# Patient Record
Sex: Male | Born: 1960 | Race: Black or African American | Hispanic: No | Marital: Single | Smoking: Current every day smoker
Health system: Southern US, Community
[De-identification: ages and names within clinical notes are randomized; demographics above are authoritative.]

## PROBLEM LIST (undated history)

## (undated) DIAGNOSIS — M4712 Other spondylosis with myelopathy, cervical region: Secondary | ICD-10-CM

## (undated) DIAGNOSIS — F1020 Alcohol dependence, uncomplicated: Secondary | ICD-10-CM

## (undated) DIAGNOSIS — F1920 Other psychoactive substance dependence, uncomplicated: Secondary | ICD-10-CM

## (undated) DIAGNOSIS — R531 Weakness: Secondary | ICD-10-CM

## (undated) DIAGNOSIS — S42002A Fracture of unspecified part of left clavicle, initial encounter for closed fracture: Secondary | ICD-10-CM

## (undated) DIAGNOSIS — I426 Alcoholic cardiomyopathy: Secondary | ICD-10-CM

## (undated) HISTORY — DX: Other spondylosis with myelopathy, cervical region: M47.12

## (undated) HISTORY — DX: Alcohol dependence, uncomplicated: F10.20

## (undated) HISTORY — DX: Fracture of unspecified part of left clavicle, initial encounter for closed fracture: S42.002A

## (undated) HISTORY — DX: Other psychoactive substance dependence, uncomplicated: F19.20

## (undated) HISTORY — PX: LEG SURGERY: SHX1003

## (undated) HISTORY — DX: Alcoholic cardiomyopathy: I42.6

## (undated) HISTORY — DX: Weakness: R53.1

---

## 1999-02-24 ENCOUNTER — Emergency Department (HOSPITAL_COMMUNITY): Admission: EM | Admit: 1999-02-24 | Discharge: 1999-02-24 | Payer: Self-pay | Admitting: Emergency Medicine

## 1999-02-24 ENCOUNTER — Encounter: Payer: Self-pay | Admitting: Emergency Medicine

## 1999-03-07 ENCOUNTER — Emergency Department (HOSPITAL_COMMUNITY): Admission: EM | Admit: 1999-03-07 | Discharge: 1999-03-07 | Payer: Self-pay | Admitting: Emergency Medicine

## 2006-10-26 ENCOUNTER — Emergency Department (HOSPITAL_COMMUNITY): Admission: EM | Admit: 2006-10-26 | Discharge: 2006-10-26 | Payer: Self-pay | Admitting: Emergency Medicine

## 2006-10-27 ENCOUNTER — Emergency Department (HOSPITAL_COMMUNITY): Admission: EM | Admit: 2006-10-27 | Discharge: 2006-10-27 | Payer: Self-pay | Admitting: Emergency Medicine

## 2015-12-30 ENCOUNTER — Encounter: Payer: Self-pay | Admitting: Internal Medicine

## 2015-12-30 ENCOUNTER — Ambulatory Visit (INDEPENDENT_AMBULATORY_CARE_PROVIDER_SITE_OTHER): Payer: Self-pay | Admitting: Internal Medicine

## 2015-12-30 VITALS — BP 100/62 | HR 100 | Resp 18 | Ht 68.0 in | Wt 163.0 lb

## 2015-12-30 DIAGNOSIS — R29898 Other symptoms and signs involving the musculoskeletal system: Secondary | ICD-10-CM | POA: Insufficient documentation

## 2015-12-30 DIAGNOSIS — F1910 Other psychoactive substance abuse, uncomplicated: Secondary | ICD-10-CM | POA: Insufficient documentation

## 2015-12-30 DIAGNOSIS — M6281 Muscle weakness (generalized): Secondary | ICD-10-CM

## 2015-12-30 DIAGNOSIS — M6289 Other specified disorders of muscle: Secondary | ICD-10-CM

## 2015-12-30 DIAGNOSIS — F1010 Alcohol abuse, uncomplicated: Secondary | ICD-10-CM

## 2015-12-30 NOTE — Patient Instructions (Addendum)
Please get signed up for orange card as soon as possible so we can fax off referrals for srays and MRI of neck

## 2015-12-30 NOTE — Progress Notes (Addendum)
Subjective:    Patient ID: Donald Salas, male    DOB: [birth year], 55 y.o.   MRN: [id number]  HPI   1.  Left arm with difficulties "hanging down"  For 2 years.  Did not note any other part of body with weakness.  Gradually developed less control and strength in arm since then.  Last 4 months, has not been able to lift his arm up.  Has to lift his arm up and place it on a horizontal surface.  Can have numbness and tingling in arm--mainly at night when sitting in chair watching TV.  No neck pain.  No headaches.  No slurred speech he is aware of.  2.  Different pains in his legs with swelling in right calf.  Does have pain in lower legs bilaterally and has to sit down and rest intermittently when he walks.  He mainly gets around via bicycle, does not notice pain with cycling.    He has not been seen by a physician since he was likely in his 30s after cutting his leg with a chain saw and seen in ED to stitch it up.  Meds:  None  Allergies: NKDA  PMH:  Unremarkable  PSH:  Repair of right leg laceration (in ED)  Social History   Social History  . Marital Status: Single    Spouse Name: N/A  . Number of Children: 5  . Years of Education: 12   Occupational History  . Handyman work    Social History Main Topics  . Smoking status: Current Every Day Smoker -- 1.50 packs/day for 79 years    Types: Cigarettes    Start date: 09/09/1969  . Smokeless tobacco: Never Used  . Alcohol Use: 0.0 oz/week    0 Standard drinks or equivalent per week     Comment: Four 40 oz Malt Liquor in a day  . Drug Use: Yes     Comment: Crack cocaine every day--smokes, Rare MJ.  Was in prison 4-5 years ago for 4 months, but started using after released.  No treatment otherwise.    [name] Sexual Activity: No   Other Topics Concern  . Not on file   Social History Narrative   Born and raised in [place].   Graduated from [employer].   Was in [employer]	[place].     Started drugs with different women he was with  throughout life.    Lives alone in a house.  Not clear how he supports his habits.    Family History  Problem Relation Age of Onset  . Breast cancer Mother   . Asthma Brother   . Eczema Brother     There is no immunization history on file for this patient.     Review of Systems     Objective:   Physical Exam NAD Appears much older then stated age.  Smells of alcohol. HEENT:  PERRL, EOMI, TMs pearly gray throat without injection.  All of teeth are either missing or discolored and broken off at gumline.   Neck:  Supple, no adenopathy or thyromegaly Axilla:  No axillary adenopathy or mass bilaterally Chest:  Dry crackles at bases posteriorly that clears with cough.   CV:  RRR with normal S1 and S2, No S3, S4 or murmur.  No carotid bruit, Carotid, radial, femoral pulses normal and equal.  No right DP or PT pulse palpable, though good coloration of toe with good cap refill.  Normal DP and slightly decreased PT pulse on  left with good cap refill of toes. Abd:  S, NT, No HSM or masses.  +BS throughout.   MS:  Significant atrophy of all musculature surrounding left scapula, left shoulder and upper arm and forearm.  Hand difficult to tell as has decreased musculature in right hand in first web due to previous laceration there.   Neuro:  A & O x3, CN II-XII grossly intact, DTRs 2+/4 in right arm and bilateral patella.  Unable to obtain ankle reflexes.  Unable to obtain DTR on left of triceps and biceps, though 1+/4 with brachioradialis. Can move left arm somewhat once on flat surface.  No movement with shoulder.  Grip 4/5 on left.  Strength with other extremities is 5/5.  Lower extremities:  Long thickened area on medial aspect of right gastrocnemius area.  NT currently.  Pt. States some weeks ago was very swollen and painful.  When swelling went down, this is what he was left with.         Assessment & Plan:  1.  Extensive Right Arm atrophy and weakness:  Needs Cspine xray and MRI.  No  other findings to suggest previous stroke.  Check CBC, CMP, lipid panel (nonfasting)  2.  Alcohol and Crack Cocaine Abuse:  Pt. Not interested in stopping use at this point.  Denies history of alcohol withdrawal when stopped drinking in past.  Has been off both before for months at a time. Referral to N. Knight,LCSW

## 2015-12-31 ENCOUNTER — Encounter: Payer: Self-pay | Admitting: Internal Medicine

## 2015-12-31 LAB — CBC WITH DIFFERENTIAL/PLATELET
Basophils Absolute: 0 10*3/uL (ref 0.0–0.2)
Basos: 0 %
EOS (ABSOLUTE): 0.2 10*3/uL (ref 0.0–0.4)
Eos: 4 %
Hematocrit: 41.7 % (ref 37.5–51.0)
Hemoglobin: 14 g/dL (ref 12.6–17.7)
Immature Grans (Abs): 0 10*3/uL (ref 0.0–0.1)
Immature Granulocytes: 0 %
Lymphocytes Absolute: 1.3 10*3/uL (ref 0.7–3.1)
Lymphs: 23 %
MCH: 30.4 pg (ref 26.6–33.0)
MCHC: 33.6 g/dL (ref 31.5–35.7)
MCV: 91 fL (ref 79–97)
Monocytes Absolute: 0.4 10*3/uL (ref 0.1–0.9)
Monocytes: 7 %
Neutrophils Absolute: 3.5 10*3/uL (ref 1.4–7.0)
Neutrophils: 66 %
Platelets: 234 10*3/uL (ref 150–379)
RBC: 4.6 x10E6/uL (ref 4.14–5.80)
RDW: 13.9 % (ref 12.3–15.4)
WBC: 5.5 10*3/uL (ref 3.4–10.8)

## 2015-12-31 LAB — COMPREHENSIVE METABOLIC PANEL
ALT: 16 IU/L (ref 0–44)
AST: 22 IU/L (ref 0–40)
Albumin/Globulin Ratio: 1.5 (ref 1.2–2.2)
Albumin: 4.3 g/dL (ref 3.5–5.5)
Alkaline Phosphatase: 99 IU/L (ref 39–117)
BUN/Creatinine Ratio: 14 (ref 9–20)
BUN: 13 mg/dL (ref 6–24)
Bilirubin Total: 0.4 mg/dL (ref 0.0–1.2)
CO2: 23 mmol/L (ref 18–29)
Calcium: 9.6 mg/dL (ref 8.7–10.2)
Chloride: 101 mmol/L (ref 96–106)
Creatinine, Ser: 0.9 mg/dL (ref 0.76–1.27)
GFR calc Af Amer: 110 mL/min/{1.73_m2} (ref >59)
GFR calc non Af Amer: 95 mL/min/{1.73_m2} (ref >59)
Globulin, Total: 2.8 g/dL (ref 1.5–4.5)
Glucose: 98 mg/dL (ref 65–99)
Potassium: 4.8 mmol/L (ref 3.5–5.2)
Sodium: 139 mmol/L (ref 134–144)
Total Protein: 7.1 g/dL (ref 6.0–8.5)

## 2015-12-31 LAB — LIPID PANEL W/O CHOL/HDL RATIO
Cholesterol, Total: 187 mg/dL (ref 100–199)
HDL: 75 mg/dL (ref >39)
LDL Calculated: 95 mg/dL (ref 0–99)
Triglycerides: 87 mg/dL (ref 0–149)
VLDL Cholesterol Cal: 17 mg/dL (ref 5–40)

## 2016-01-10 ENCOUNTER — Ambulatory Visit (INDEPENDENT_AMBULATORY_CARE_PROVIDER_SITE_OTHER): Payer: Self-pay | Admitting: Licensed Clinical Social Worker

## 2016-01-10 DIAGNOSIS — IMO0002 Reserved for concepts with insufficient information to code with codable children: Secondary | ICD-10-CM

## 2016-01-10 DIAGNOSIS — F1590 Other stimulant use, unspecified, uncomplicated: Secondary | ICD-10-CM

## 2016-01-10 DIAGNOSIS — F1099 Alcohol use, unspecified with unspecified alcohol-induced disorder: Secondary | ICD-10-CM
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
## 2016-01-17 ENCOUNTER — Other Ambulatory Visit: Payer: Self-pay | Admitting: Licensed Clinical Social Worker

## 2016-01-21 ENCOUNTER — Telehealth: Payer: Self-pay | Admitting: Internal Medicine

## 2016-02-01 ENCOUNTER — Encounter: Payer: Self-pay | Admitting: Licensed Clinical Social Worker

## 2016-02-03 ENCOUNTER — Encounter: Payer: Self-pay | Admitting: Internal Medicine

## 2016-02-03 DIAGNOSIS — R29898 Other symptoms and signs involving the musculoskeletal system: Secondary | ICD-10-CM

## 2016-02-08 ENCOUNTER — Encounter: Payer: Self-pay | Admitting: Internal Medicine

## 2017-09-27 ENCOUNTER — Encounter (HOSPITAL_COMMUNITY): Payer: Self-pay

## 2017-09-27 ENCOUNTER — Emergency Department (HOSPITAL_COMMUNITY)
Admission: EM | Admit: 2017-09-27 | Discharge: 2017-09-27 | Disposition: A | Discharge status: Home / Self Care | Payer: Self-pay | Attending: Emergency Medicine | Admitting: Emergency Medicine

## 2017-09-27 ENCOUNTER — Other Ambulatory Visit: Payer: Self-pay

## 2017-09-27 ENCOUNTER — Emergency Department (HOSPITAL_COMMUNITY): Payer: Self-pay

## 2017-09-27 DIAGNOSIS — R42 Dizziness and giddiness: Secondary | ICD-10-CM

## 2017-09-27 DIAGNOSIS — M7918 Myalgia, other site: Secondary | ICD-10-CM | POA: Insufficient documentation

## 2017-09-27 DIAGNOSIS — R29898 Other symptoms and signs involving the musculoskeletal system: Secondary | ICD-10-CM | POA: Insufficient documentation

## 2017-09-27 DIAGNOSIS — R200 Anesthesia of skin: Secondary | ICD-10-CM | POA: Insufficient documentation

## 2017-09-27 DIAGNOSIS — R531 Weakness: Secondary | ICD-10-CM | POA: Insufficient documentation

## 2017-09-27 DIAGNOSIS — F17210 Nicotine dependence, cigarettes, uncomplicated: Secondary | ICD-10-CM | POA: Insufficient documentation

## 2017-09-27 DIAGNOSIS — M2550 Pain in unspecified joint: Secondary | ICD-10-CM

## 2017-09-27 DIAGNOSIS — M791 Myalgia, unspecified site: Secondary | ICD-10-CM

## 2017-09-27 LAB — URINALYSIS, ROUTINE W REFLEX MICROSCOPIC
Bilirubin Urine: NEGATIVE
Glucose, UA: NEGATIVE mg/dL
Hgb urine dipstick: NEGATIVE
Ketones, ur: NEGATIVE mg/dL
Leukocytes, UA: NEGATIVE
Nitrite: NEGATIVE
Protein, ur: NEGATIVE mg/dL
Specific Gravity, Urine: 1.009 (ref 1.005–1.030)
pH: 5 (ref 5.0–8.0)

## 2017-09-27 LAB — CBC
HCT: 44.1 % (ref 39.0–52.0)
Hemoglobin: 15.1 g/dL (ref 13.0–17.0)
MCH: 30.9 pg (ref 26.0–34.0)
MCHC: 34.2 g/dL (ref 30.0–36.0)
MCV: 90.4 fL (ref 78.0–100.0)
PLATELETS: 235 10*3/uL (ref 150–400)
RBC: 4.88 MIL/uL (ref 4.22–5.81)
RDW: 13.4 % (ref 11.5–15.5)
WBC: 6.5 10*3/uL (ref 4.0–10.5)

## 2017-09-27 LAB — RAPID URINE DRUG SCREEN, HOSP PERFORMED
Amphetamines: NOT DETECTED
Barbiturates: NOT DETECTED
Benzodiazepines: NOT DETECTED
Cocaine: POSITIVE — AB
Opiates: NOT DETECTED
Tetrahydrocannabinol: NOT DETECTED

## 2017-09-27 LAB — BASIC METABOLIC PANEL
Anion gap: 8 (ref 5–15)
BUN: 15 mg/dL (ref 6–20)
CHLORIDE: 103 mmol/L (ref 101–111)
CO2: 25 mmol/L (ref 22–32)
CREATININE: 1.02 mg/dL (ref 0.61–1.24)
Calcium: 9.7 mg/dL (ref 8.9–10.3)
GFR calc Af Amer: >60 mL/min (ref >60)
GFR calc non Af Amer: >60 mL/min (ref >60)
GLUCOSE: 118 mg/dL — AB (ref 65–99)
Potassium: 4.2 mmol/L (ref 3.5–5.1)
Sodium: 136 mmol/L (ref 135–145)

## 2017-09-27 NOTE — Discharge Instructions (Signed)
Start taking a multivitamin.  I have attached information for resources to help you quit using drugs and alcohol.  Follow-up with [facility] for reevaluation.  I have also attached information for follow-up with a neurologist for reevaluation of your weakness.  Return to the ED if any concerning signs or symptoms develop.

## 2017-09-27 NOTE — ED Provider Notes (Signed)
[facility] EMERGENCY DEPARTMENT Provider Note   CSN: [id number] Arrival date & time: 09/27/17  1112     History   Chief Complaint Chief Complaint  Patient presents with  . Generalized Body Aches    HPI Donald Salas is a 57 y.o. male with history of alcohol abuse and drug abuse presents today with chief complaint gradual onset, progressive Nedra Hai worsening generalized myalgias and arthralgias as well as weakness of the left upper extremity and right lower extremity for several months.  Patient states his symptoms are unchanged but "people have been telling me that I should get checked out since I have been walking funny".  He notes intermittent numbness and tingling of his extremities as well as sharp aches and pains of his joints intermittently.  He also notes intermittent lightheadedness which resulted in falls but denies syncope or head injury recently.  He denies fevers, chills, chest pain, shortness of breath, abdominal pain, nausea, vomiting, or urinary symptoms.  He states that he drinks "as much beer as I can get my hands on", typically 3-4 40s daily.  He also notes he smokes crack cocaine but denies IV drug use.  The history is provided by the patient.    Past Medical History:  Diagnosis Date  . Closed left clavicular fracture when in middle school    Patient Active Problem List   Diagnosis Date Noted  . Left arm weakness 12/30/2015  . Alcohol abuse 12/30/2015  . Drug abuse (HCC) 12/30/2015    Past Surgical History:  Procedure Laterality Date  . Right Leg Laceration Repair     Local anesthetic only       Home Medications    Prior to Admission medications   Not on File    Family History Family History  Problem Relation Age of Onset  . Breast cancer Mother   . Asthma Brother   . Eczema Brother     Social History Social History   Tobacco Use  . Smoking status: Current Every Day Smoker    Packs/day: 1.50    Years: 79.00    Pack  years: 118.50    Types: Cigarettes    Start date: 09/09/1969  . Smokeless tobacco: Never Used  Substance Use Topics  . Alcohol use: Yes    Alcohol/week: 0.0 oz    Comment: Four 40 oz Malt Liquor in a day  . Drug use: Yes    Types: Cocaine    Comment: Crack cocaine every day--smokes, Rare MJ.  Was in prison 4-5 years ago for 4 months, but started using after released.  No treatment otherwise.       Allergies   Patient has no known allergies.   Review of Systems Review of Systems  Constitutional: Negative for chills and fever.  Eyes: Negative for visual disturbance.  Respiratory: Negative for shortness of breath.   Cardiovascular: Negative for chest pain.  Gastrointestinal: Negative for abdominal pain, diarrhea, nausea and vomiting.  Musculoskeletal: Positive for arthralgias and myalgias.  Neurological: Positive for weakness, light-headedness and numbness. Negative for syncope.  All other systems reviewed and are negative.    Physical Exam Updated Vital Signs BP 121/74 (BP Location: Left Arm)   Pulse 88   Temp 98.3 F (36.8 C) (Oral)   Resp 18   Ht 5\' 9"  (1.753 m)   Wt 72.6 kg (160 lb)   SpO2 97%   BMI 23.63 kg/m   Physical Exam  Constitutional: He is oriented to person, place, and  time. He appears well-developed and well-nourished. No distress.  HENT:  Head: Normocephalic and atraumatic.  Eyes: Conjunctivae and EOM are normal. Pupils are equal, round, and reactive to light. Right eye exhibits no discharge. Left eye exhibits no discharge.  Neck: Normal range of motion. Neck supple. No JVD present. No tracheal deviation present.  Cardiovascular: Normal rate, regular rhythm, normal heart sounds and intact distal pulses.  Pulmonary/Chest: Effort normal and breath sounds normal. No stridor. No respiratory distress. He has no wheezes. He has no rales. He exhibits no tenderness.  Abdominal: Soft. Bowel sounds are normal. There is no tenderness.  Musculoskeletal: He  exhibits no edema.  Normal passive range of motion of all joints of the extremities.  However, patient will hold his left upper extremity with his elbow extended and states that he is unable to flex the elbow actively.  Also endorses inability to abduct the left shoulder actively.  5/5 strength of right upper extremity and bilateral lower extremities.  3+/5 strength of the biceps, triceps, and deltoids of the left upper extremity with good grip strength bilaterally.  However, patient is able to hold his left upper extremity up against gravity without difficulty and moves his extremities spontaneously.  He is also able to pull himself up to a seated and standing position using his left upper extremity without difficulty. able to walk on heels and toes without difficulty although gait is antalgic.  Lymphadenopathy:    He has no cervical adenopathy.  Neurological: He is alert and oriented to person, place, and time. No cranial nerve deficit or sensory deficit. He exhibits normal muscle tone.  Mental Status:  Alert, thought content appropriate, able to give a coherent history. Speech fluent without evidence of aphasia. Able to follow 2 step commands without difficulty.  Cranial Nerves:  II:  Peripheral visual fields grossly normal, pupils equal, round, reactive to light III,IV, VI: ptosis not present, extra-ocular motions intact bilaterally  V,VII: smile symmetric, facial light touch sensation equal VIII: hearing grossly normal to voice  X: uvula elevates symmetrically  XI: bilateral shoulder shrug symmetric and strong XII: midline tongue extension without fassiculations Motor:  Normal tone. 5/5 strength of BUE and BLE major muscle groups including strong and equal grip strength and dorsiflexion/plantar flexion Sensory: light touch normal in all extremities.  Cerebellar: Difficulty with finger-to-nose with bilateral upper extremities, left greater than right. Gait: Antalgic gait, walks with a limp of  the left lower extremity.  Normal balance. Able to walk on toes and heels without significant difficulty.  Negative Romberg.  CV: 2+ radial and DP/PT pulses   Skin: Skin is warm and dry. No erythema.  Psychiatric: He has a normal mood and affect. His behavior is normal.  Nursing note and vitals reviewed.    ED Treatments / Results  Labs (all labs ordered are listed, but only abnormal results are displayed) Labs Reviewed  BASIC METABOLIC PANEL - Abnormal; Notable for the following components:      Result Value   Glucose, Bld 118 (*)    All other components within normal limits  RAPID URINE DRUG SCREEN, HOSP PERFORMED - Abnormal; Notable for the following components:   Cocaine POSITIVE (*)    All other components within normal limits  URINALYSIS, ROUTINE W REFLEX MICROSCOPIC - Abnormal; Notable for the following components:   Color, Urine STRAW (*)    All other components within normal limits  CBC    EKG  EKG Interpretation  Date/Time:  Thursday September 27 2017 12:00:30  EST Ventricular Rate:  97 PR Interval:  180 QRS Duration: 88 QT Interval:  380 QTC Calculation: 482 R Axis:   72 Text Interpretation:  Normal sinus rhythm Possible Left atrial enlargement Left ventricular hypertrophy Nonspecific T wave abnormality Prolonged QT Abnormal ECG Confirmed by Tilden Fossaees, Elizabeth [phone]([id number]) on 09/27/2017 5:55:06 PM       Radiology Ct Head Wo Contrast  Result Date: 09/27/2017 CLINICAL DATA:  Generalized weakness with headache intermittent dizziness for several months. EXAM: CT HEAD WITHOUT CONTRAST TECHNIQUE: Contiguous axial images were obtained from the base of the skull through the vertex without intravenous contrast. COMPARISON:  None. FINDINGS: Brain: The midportion of the scan is degraded by motion. No evidence of infarct, hemorrhage, hydrocephalus, or mass. Vascular: Atherosclerotic calcification.  No hyperdense vessel. Skull: Negative Sinuses/Orbits: Negative IMPRESSION: Motion  degraded head CT without acute finding. No explanation for headache. Electronically Signed   By: Marnee SpringJonathon  Watts M.D.   On: 09/27/2017 19:35    Procedures Procedures (including critical care time)  Medications Ordered in ED Medications - No data to display   Initial Impression / Assessment and Plan / ED Course  I have reviewed the triage vital signs and the nursing notes.  Pertinent labs & imaging results that were available during my care of the patient were reviewed by me and considered in my medical decision making (see chart for details).     Patient presents with intermittent arthralgias, myalgias, and intermittent neurological symptoms which have been ongoing and unchanged for several months.  He also notes that he now walks with a limp to the left side and has difficulty flexing his left elbow.  However he is able to hold his extremities up against gravity without difficulty and is able to use his left upper extremity to push himself up to a seated position without difficulty.  He does walk with a limp, however is able to walk on his heels and toes without difficulty displaying intact strength of all major muscle groups in the lower extremities including bilateral EHL strength. He denies any red flag signs concerning for cauda equina or spinal abscess such as fevers, chills, bowel or bladder incontinence, saddle anesthesia, IV drug use, or history of cancer.  Lab work is reassuring.  UDS is consistent with the history patient provided to me.  CT of the head shows no acute findings.  With symptoms ongoing and unchanged for several months, no further emergent workup required at this time.  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  On reevaluation, patient is resting comfortably with no complaints.  He does not appear to be in DTs.  He does express to me intent to quit drinking alcohol and using drugs, and I provided him with outpatient resources for substance  abuse counseling.  I also recommended that he begin taking a multivitamin.  He will follow-up with [facility] on an outpatient basis.  Discussed indications for return to the ED. Pt verbalized understanding of and agreement with plan and is safe for discharge home at this time.  Final Clinical Impressions(s) / ED Diagnoses   Final diagnoses:  Polyarthralgia  Myalgia  Intermittent lightheadedness    ED Discharge Orders    None       Bennye AlmFawze, Hailee Hollick A, PA-C 09/28/17 1036    Tilden Fossaees, Elizabeth, MD 09/28/17 1254

## 2017-09-27 NOTE — ED Triage Notes (Signed)
Per Pt, Pt is coming from home with complaints of generalized pain in arms and legs "for a while now." Pt reports some left arm weakness that started four months ago. Pt denies any acute symptom changes today. Reports some dizziness intermittent in the last couple months. Denies any chest pain.

## 2017-09-27 NOTE — ED Triage Notes (Signed)
Pt denies suicidal thoughts today, but states, "Since I have been in pain, I have had once in a blue moon thoughts of harming myself if I drink too much or something."

## 2017-10-09 ENCOUNTER — Other Ambulatory Visit: Payer: Self-pay

## 2017-10-09 ENCOUNTER — Ambulatory Visit (INDEPENDENT_AMBULATORY_CARE_PROVIDER_SITE_OTHER): Payer: Self-pay | Admitting: Physician Assistant

## 2017-10-09 ENCOUNTER — Encounter (INDEPENDENT_AMBULATORY_CARE_PROVIDER_SITE_OTHER): Payer: Self-pay | Admitting: Physician Assistant

## 2017-10-09 VITALS — BP 155/94 | HR 99 | Temp 98.1°F | Ht 69.29 in | Wt 167.2 lb

## 2017-10-09 DIAGNOSIS — M25561 Pain in right knee: Secondary | ICD-10-CM

## 2017-10-09 DIAGNOSIS — Z114 Encounter for screening for human immunodeficiency virus [HIV]: Secondary | ICD-10-CM

## 2017-10-09 DIAGNOSIS — M25562 Pain in left knee: Secondary | ICD-10-CM

## 2017-10-09 DIAGNOSIS — F1490 Cocaine use, unspecified, uncomplicated: Secondary | ICD-10-CM

## 2017-10-09 DIAGNOSIS — Z131 Encounter for screening for diabetes mellitus: Secondary | ICD-10-CM

## 2017-10-09 DIAGNOSIS — F1010 Alcohol abuse, uncomplicated: Secondary | ICD-10-CM

## 2017-10-09 DIAGNOSIS — M62522 Muscle wasting and atrophy, not elsewhere classified, left upper arm: Secondary | ICD-10-CM

## 2017-10-09 DIAGNOSIS — G8929 Other chronic pain: Secondary | ICD-10-CM

## 2017-10-09 LAB — POCT GLYCOSYLATED HEMOGLOBIN (HGB A1C): Hemoglobin A1C: 5.4

## 2017-10-09 MED ORDER — NAPROXEN 500 MG PO TABS: 500.0000 mg | ORAL_TABLET | Freq: Two times a day (BID) | ORAL | 0 refills | Status: DC

## 2017-10-09 NOTE — Patient Instructions (Addendum)
Community Resources  Advocacy/Legal Legal Aid [place]:  1-866-219-5262  /  336-272-0148  Family Justice Center:  336-641-7233  [facility] 24-hr Crisis line:  336-273-7273  Women's Resource Center, [place]:  336-275-6090  Court Watch (custody):  336-275-2346  [facility] [facility]:   336-279-9299    Baby & Breastfeeding Car Seat Inspection @ Various [place] Fire Depts.- call 336-373-2177  [facility]  336-832-6860  [facility] 336-878-6712  WIC: 336-641-3663 ([place]);  336-641-7571 ([place])  La Leche League:  1-877-452-5321   Childcare [place] [facility]: 336-369-5097 ([place]) / 336-887-8224 ([place])  - Child Care Resources/ Referrals/ Scholarships  - Head Start/ Early Head Start (call or apply online)  [place] DHHS: [place] Pre-[facility] :  1-800-859-0829 / 336-274-5437   Employment / Job Search [facility] [place]: 336-275-6090 / [address]  [place] [facility] [employer] [facility] ([employer]Link): 336-373-5922 ([place]) / 336-882-4141 ([place])  [facility]/ Career Center: 336-275-9801 / 336-282-7307  [facility] Career [facility]: 336-373-3764  DHHS Work First: 336-641-3447 ([place]) / 336-641-3447 ([place])  [facility] [place]:  336-676-5871   Financial Assistance [facility]:  336-553-2657  [facility]: 336-235-0368  [employer] (furniture):  336-370-4002  [place] [facility]: 336-373-4264  Low Income Energy Assistance  336-641-3000   Food Assistance DHHS- SNAP/ Food Stamps: 336-641-4588  WIC: [place]- 336-641-3663 ;  [place] 336-641-7571  Little Green Book- Free Meals  Little Blue Book- Free Food Pantries  During the summer, text "FOOD" to 877877   General Health / Clinics (Adults) Orange Card (for Adults) through [facility]: (336) 895-4900  [facility]:   336-832-8035  [facility]:   336-832-4444  Health Department:  336-641-3245  [facility]:  336-415-3877 / 336-641-2100  [facility] [place]:   336-373-0678  [facility]:   336-334-4822 x 50251   Housing [place] [facility]:   336-691-9521  [place] [facility] Authority:  336-275-8501  Affordable Housing Managemnt:  336-273-0568   Immigrant/ Refugee Center for [place]ians (UNCG):  336-256-1065  [facility]:  336-379-0037  [facility]:  336-937-4701  [employer] Services:  336-617-0381  African Services Coalition:  336-574-2677   LGBTQ YouthSAFE  www.youthsafe[place].org  PFLAG  336-541-6754 / info@[place].org  The [facility] Project:  1-866-488-7386   Mental Health/ Substance Use [facility]  336-387-6161  [facility]:  336-832-9700 or 1-800-711-2635  [facility] of Care:  336-271-5888  [facility]:  336-294-1349  [facility]:  336-542-2884  [facility] (walk-ins)  336-676-6840 / [address]  [place]:  800-449-1287  Alcoholics Anonymous:  336-854-4278  Narcotics Anonymous:  800-365-1036  Quit Smoking Hotline:  800-QUIT-NOW (800-784-8669)   Parenting Children's Home Society:  800-632-1400  [place]: Education Center & Support Groups:  336-832-6682  YWCA: 336-273-3461  [employer]CG: Bringing Out the Best:  336-334-3120               Thriving at Three (Hispanic families): 336-256-1066  Healthy Start ([facility]):  336-387-6161 x2288  Parents as Teachers:  336-691-0024  [place] [facility]- Learning Together (Immigrants): 336-369-5001   Poison Control 800-222-1222  Sports & Recreation [employer] Open Doors Application: ymcanwnc.org/join/open-doors-financial-assistance/  City of [place] Recreation Centers: http://www.[place].gov/index.aspx?page=3615   Special Needs Family Support Network:  336-832-6507  Autism Society of :   336-333-0197 x1402 or x1412 /  800-785-1035  [facility] [place]:  336-334-5773     [facility] [place]:  830-769-3092  Children's Developmental Service Agency (CDSA):  (727) 163-9637  [facility] (Care Coordination for Children):  (647)566-2028   Transportation Medicaid Transportation: 640-542-8655 to apply  [place] Authority: 480-881-9148 (reduced-fare bus ID to Medicaid/ Medicare/ Orange Card)  SCAT Paratransit services: Eligible riders only, call (917)129-7463 for application   Tutoring/Mentoring [facility]: 414-703-9902  [facility] Sisters: (913)783-6075 Manley Mason)  220-638-9495 ([place])  ACES through child's school: 351 741 0812  [employer] Achievers: contact your local [place] Dys Mentor Program: 3028070929    Alcohol Use Disorder Alcohol use disorder is when your drinking disrupts your daily life. When you have this condition, you drink too much alcohol and you cannot control your drinking. Alcohol use disorder can cause serious problems with your physical health. It can affect your brain, heart, liver, pancreas, immune system, stomach, and intestines. Alcohol use disorder can increase your risk for certain cancers and cause problems with your mental health, such as depression, anxiety, psychosis, delirium, and dementia. People with this disorder risk hurting themselves and others. What are the causes? This condition is caused by drinking too much alcohol over time. It is not caused by drinking too much alcohol only one or two times. Some people with this condition drink alcohol to cope with or escape from negative life events. Others drink to relieve pain or symptoms of mental illness. What increases the risk? You are more likely to develop this condition if:  You have a family history of alcohol use disorder.  Your culture encourages drinking to the point of intoxication, or makes alcohol easy to get.  You had a mood or conduct disorder in childhood.  You have been a victim of abuse.  You are an adolescent and: ? You have poor grades or  difficulties in school. ? Your caregivers do not talk to you about saying no to alcohol, or supervise your activities. ? You are impulsive or you have trouble with self-control.  What are the signs or symptoms? Symptoms of this condition include:  Drinkingmore than you want to.  Drinking for longer than you want to.  Trying several times to drink less or to control your drinking.  Spending a lot of time getting alcohol, drinking, or recovering from drinking.  Craving alcohol.  Having problems at work, at school, or at home due to drinking.  Having problems in relationships due to drinking.  Drinking when it is dangerous to drink, such as before driving a car.  Continuing to drink even though you know you might have a physical or mental problem related to drinking.  Needing more and more alcohol to get the same effect you want from the alcohol (building up tolerance).  Having symptoms of withdrawal when you stop drinking. Symptoms of withdrawal include: ? Fatigue. ? Nightmares. ? Trouble sleeping. ? Depression. ? Anxiety. ? Fever. ? Seizures. ? Severe confusion. ? Feeling or seeing things that are not there (hallucinations). ? Tremors. ? Rapid heart rate. ? Rapid breathing. ? High blood pressure.  Drinking to avoid symptoms of withdrawal.  How is this diagnosed? This condition is diagnosed with an assessment. Your health care provider may start the assessment by asking three or four questions about your drinking. Your health care provider may perform a physical exam or do lab tests to see if you have physical problems resulting from alcohol use. She or he may refer you to a mental health professional for evaluation. How is this treated? Some people with alcohol use disorder are  able to reduce their alcohol use to low-risk levels. Others need to completely quit drinking alcohol. When necessary, mental health professionals with specialized training in substance use  treatment can help. Your health care provider can help you decide how severe your alcohol use disorder is and what type of treatment you need. The following forms of treatment are available:  Detoxification. Detoxification involves quitting drinking and using prescription medicines within the first week to help lessen withdrawal symptoms. This treatment is important for people who have had withdrawal symptoms before and for heavy drinkers who are likely to have withdrawal symptoms. Alcohol withdrawal can be dangerous, and in severe cases, it can cause death. Detoxification may be provided in a home, community, or primary care setting, or in a hospital or substance use treatment facility.  Counseling. This treatment is also called talk therapy. It is provided by substance use treatment counselors. A counselor can address the reasons you use alcohol and suggest ways to keep you from drinking again or to prevent problem drinking. The goals of talk therapy are to: ? Find healthy activities and ways for you to cope with stress. ? Identify and avoid the things that trigger your alcohol use. ? Help you learn how to handle cravings.  Medicines.Medicines can help treat alcohol use disorder by: ? Decreasing alcohol cravings. ? Decreasing the positive feeling you have when you drink alcohol. ? Causing an uncomfortable physical reaction when you drink alcohol (aversion therapy).  Support groups. Support groups are led by people who have quit drinking. They provide emotional support, advice, and guidance.  These forms of treatment are often combined. Some people with this condition benefit from a combination of treatments provided by specialized substance use treatment centers. Follow these instructions at home:  Take over-the-counter and prescription medicines only as told by your health care provider.  Check with your health care provider before starting any new medicines.  Ask friends and family  members not to offer you alcohol.  Avoid situations where alcohol is served, including gatherings where others are drinking alcohol.  Create a plan for what to do when you are tempted to use alcohol.  Find hobbies or activities that you enjoy that do not include alcohol.  Keep all follow-up visits as told by your health care provider. This is important. How is this prevented?  If you drink, limit alcohol intake to no more than 1 drink a day for nonpregnant women and 2 drinks a day for men. One drink equals 12 oz of beer, 5 oz of wine, or 1 oz of hard liquor.  If you have a mental health condition, get treatment and support.  Do not give alcohol to adolescents.  If you are an adolescent: ? Do not drink alcohol. ? Do not be afraid to say no if someone offers you alcohol. Speak up about why you do not want to drink. You can be a positive role model for your friends and set a good example for those around you by not drinking alcohol. ? If your friends drink, spend time with others who do not drink alcohol. Make new friends who do not use alcohol. ? Find healthy ways to manage stress and emotions, such as meditation or deep breathing, exercise, spending time in nature, listening to music, or talking with a trusted friend or family member. Contact a health care provider if:  You are not able to take your medicines as told.  Your symptoms get worse.  You return to drinking alcohol (  relapse) and your symptoms get worse. Get help right away if:  You have thoughts about hurting yourself or others. If you ever feel like you may hurt yourself or others, or have thoughts about taking your own life, get help right away. You can go to your nearest emergency department or call:  Your local emergency services (911 in the U.S[place]  A suicide crisis helpline, such as the National Suicide Prevention Lifeline at 941-674-3815. This is open 24 hours a day.  Summary  Alcohol use disorder is when  your drinking disrupts your daily life. When you have this condition, you drink too much alcohol and you cannot control your drinking.  Treatment may include detoxification, counseling, medicine, and support groups.  Ask friends and family members not to offer you alcohol. Avoid situations where alcohol is served.  Get help right away if you have thoughts about hurting yourself or others. This information is not intended to replace advice given to you by your health care provider. Make sure you discuss any questions you have with your health care provider. Document Released: 10/19/2004 Document Revised: 06/08/2016 Document Reviewed: 06/08/2016 Elsevier Interactive Patient Education  2018 [employer]. Stimulant Use Disorder-Amphetamines Amphetamines belong to a group of powerful drugs known as stimulants. Common street names for amphetamines include speed and crank. Amphetamines have a number of medical uses, but they are often misused because of the effects that they produce. These effects include:  A feeling of extreme pleasure (euphoria).  Alertness.  Increased attention.  A high energy level.  Loss of appetite.  Stimulant use disorder is when your stimulant use disrupts your daily life. It may disrupt your relationships and how you do your job. Stimulant use disorder can be dangerous. Amphetamines increase your blood pressure and heart rate. Using them can lead to a heart attack or stroke. Amphetamines can also make your heart rate irregular, cause seizures, and raise your body temperature. These problems can lead to death. What are the causes? This condition is caused by misusing amphetamines for a period of time, such as by taking them for reasons other than to treat a diagnosed problem. Many people start using amphetamines because they make them feel good. Over time, they get addicted to them. When they try to stop using them, they feel sick. What increases the risk? This condition  is more likely to develop in people who:  Misuse other drugs.  Have problems with mood or behavior.  What are the signs or symptoms? Symptoms of this condition include:  Using greater amounts of an amphetamine than you want to, or using an amphetamine for longer than you want to.  Trying several times to use less of an amphetamine or to control your amphetamine use.  Craving amphetamines.  Spending a lot of time getting amphetamines, using them, or recovering from their effects.  Having problems at work, at school, at home, or in relationships because of amphetamine use.  Giving up or cutting down on important life activities because of amphetamine use.  Using amphetamines when it is dangerous, such as when driving a car.  Continuing to use amphetamines even though they are causing or have led to a physical problem, such as: ? Unintended weight loss. ? High blood pressure. ? Chest pain. ? An infection, such as hepatitis or HIV (human immunodeficiency virus).  Continuing to use amphetamines even though they are causing a mental problem, such as: ? Anxiety. ? Sleep problems. ? Schizophrenia-like symptoms. ? Depression. ? Bipolar mood swings. ?  Violent behavior.  Needing more and more of an amphetamine to get the same effect that you want (building up a tolerance).  Having symptoms of withdrawal when you stop using an amphetamine. Symptoms of withdrawal include: ? Headache. ? Chills. ? Palpitations or an irregular heart beat. ? Changes in blood pressure. ? Chest pain. ? Dry mouth or changes in taste. ? Cramping in the abdomen. ? Nausea, vomiting, or diarrhea. ? Mood changes. ? Fatigue. ? Sleep changes or bad dreams. ? Increased appetite. ? Restlessness.  How is this diagnosed? This condition is diagnosed with an assessment. During the assessment, your health care provider will ask about your amphetamine use and about how it affects your life. You will be diagnosed  with the condition if you have had at least two symptoms of this condition within a 45-month period. How severe the condition is depends on how many symptoms you have:  If you have 2 or 3 symptoms, your condition is mild.  If you have 4 or 5 symptoms, your condition is moderate.  If you have 6 or more symptoms, your condition is severe.  Your health care provider may perform a physical exam or do lab tests to see if you have physical problems resulting from amphetamine use. Your health care provider may also screen for drug use and refer you to a mental health professional for evaluation. How is this treated? There are two types of treatment for this condition:  Short-term medical treatment. This type helps you live your life. It helps prevent or minimize damage from any physical or mental problems that are related to your amphetamine use.  Long-term substance abuse treatment. This type helps you stop using amphetamines. It is usually provided by mental health professionals with training in substance use disorders. It usually involves a combination of the following: ? Counseling. This treatment is also called talk therapy. It is provided by substance use treatment counselors. A counselor can address the reasons you use amphetamines and suggest ways to keep you from using amphetamines again. The goals of talk therapy are to find healthy activities and ways to cope with stress, identify and avoid what triggers your amphetamine use, and help you learn how to handle cravings. ? Support groups. Support groups are run by people who have quit using stimulants. They provide emotional support, advice, and guidance. ? Medicine. Medicines may be prescribed to reduce cravings or to block the good feeling that you get from using amphetamines.  Follow these instructions at home:  Take over-the-counter and prescription medicines only as told by your health care provider.  Check with your health care provider  before starting any new medicines.  Keep all follow-up visits as told by your health care provider. This is important. Where to find more information:  [employer] on Drug Abuse: http://www.price-smith.com/  Substance Abuse and Mental Health Services Administration: SkateOasis.com.pt Contact a health care provider if:  Your symptoms get worse.  You use amphetamines again.  You are not able to take medicines as told. Get help right away if:  You have serious thoughts about hurting yourself or others.  You have a seizure.  You have chest pain.  You have sudden weakness.  You lose some of your vision.  You lose some of your speech. If you ever feel like you may hurt yourself or others, or have thoughts about taking your own life, get help right away. You can go to your nearest emergency department or call:  Your local emergency  services (911 in the [place]  A suicide crisis helpline, such as the National Suicide Prevention Lifeline at (581) 721-93661-720-691-7282. This is open 24 hours a day.  This information is not intended to replace advice given to you by your health care provider. Make sure you discuss any questions you have with your health care provider. Document Released: 09/05/2001 Document Revised: 06/23/2016 Document Reviewed: 06/23/2016 Elsevier Interactive Patient Education  [employer]2018 Elsevier Inc.

## 2017-10-09 NOTE — Progress Notes (Signed)
Subjective:  Patient ID: Donald CrumblySamuel L Lerette, male    DOB: 10-Aug-1961  Age: 57 y.o. MRN: [id number]  CC: pain in legs, thighs, and arms  HPI Donald Salas is a 57 y.o. male with a medical history of alcohol abuse, crack cocaine abuse, left arm weakness, and muscle mass of leg presents as a new patient with complaint of left arm weakness and "locking". Seen by Dr. Delrae AlfredMulberry on 12/30/15 for similar symptoms and was found to have extensive atrophic left arm atrophy and weakness. She ordered C spine XR and MRI but patient did not have funds or financial assistance to have imaging done. Pt has not tried to complete applications for financial assistance to this day.   Pt still drinking alcohol and using crack/cocaine. Says his nephew supplies him with crack for free. Says he is trying to reduce amount of alcohol/crack he consumes but is not ready to quit entirely.    ROS Review of Systems  Constitutional: Negative for chills, fever and malaise/fatigue.  Eyes: Negative for blurred vision.  Respiratory: Negative for shortness of breath.   Cardiovascular: Negative for chest pain and palpitations.  Gastrointestinal: Negative for abdominal pain and nausea.  Genitourinary: Negative for dysuria and hematuria.  Musculoskeletal: Positive for joint pain. Negative for myalgias.  Skin: Negative for rash.  Neurological: Negative for tingling and headaches.  Psychiatric/Behavioral: Negative for depression. The patient is not nervous/anxious.     Objective:  BP (!) 155/94 (BP Location: Right Arm, Patient Position: Sitting, Cuff Size: Normal)   Pulse 99   Temp 98.1 F (36.7 C) (Oral)   Ht 5' 9.29" (1.76 m)   Wt 167 lb 3.2 oz (75.8 kg)   SpO2 98%   BMI 24.48 kg/m   BP/Weight 10/09/2017 09/27/2017 12/30/2015  Systolic BP 155 121 100  Diastolic BP 94 74 62  Wt. (Lbs) 167.2 160 163  BMI 24.48 23.63 24.79      Physical Exam  Constitutional: He is oriented to person, place, and time.  Thin, disheveled, NAD,  polite  HENT:  Head: Normocephalic and atraumatic.  Eyes: No scleral icterus.  Neck: Normal range of motion. Neck supple. No thyromegaly present.  Cardiovascular: Normal rate, regular rhythm and normal heart sounds.  Pulmonary/Chest: Effort normal and breath sounds normal.  Abdominal: Soft. Bowel sounds are normal. There is no tenderness.  Musculoskeletal: He exhibits no edema.  Neurological: He is alert and oriented to person, place, and time.  Skin: Skin is warm and dry. No rash noted. No erythema. No pallor.  Psychiatric: He has a normal mood and affect. His behavior is normal. Thought content normal.  Vitals reviewed.    Assessment & Plan:    1. Alcohol abuse - CBC with Differential - Comprehensive metabolic panel - Patient is not ready to quit.  2. Crack cocaine use - CBC with Differential - Comprehensive metabolic panel - Pt is not ready to quit.  3. 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 This seems to be a chronic issue as reflected by medical notes from 12/2015. Advised to start applications for financial assistance.  4. Chronic pain of both knees - naproxen (NAPROSYN) 500 MG tablet; Take 1 tablet (500 mg total) by mouth 2 (two) times daily with a meal.  Dispense: 30 tablet; Refill: 0  5. Screening for diabetes mellitus - HgB A1c 5.4% in clinic today  6. Screening for HIV (human immunodeficiency virus) - HIV antibody   Meds ordered this encounter  Medications  . naproxen (NAPROSYN) 500 MG tablet    Sig: Take 1 tablet (500 mg total) by mouth 2 (two) times daily with a meal.    Dispense:  30 tablet    Refill:  0    Order Specific Question:   Supervising Provider    Answer:   Quentin Angst [[id number]]    Follow-up: Return in about 8 weeks (around 12/04/2017) for pain and atrophy.   [name] PA

## 2017-10-09 NOTE — Progress Notes (Signed)
Pt reports having dizzy spells, he states he falls out but doesn't stay out

## 2017-10-10 LAB — COMPREHENSIVE METABOLIC PANEL
ALBUMIN: 4.6 g/dL (ref 3.5–5.5)
ALT: 22 IU/L (ref 0–44)
AST: 20 IU/L (ref 0–40)
Albumin/Globulin Ratio: 1.7 (ref 1.2–2.2)
Alkaline Phosphatase: 88 IU/L (ref 39–117)
BUN / CREAT RATIO: 10 (ref 9–20)
BUN: 11 mg/dL (ref 6–24)
Bilirubin Total: 0.3 mg/dL (ref 0.0–1.2)
CALCIUM: 9.8 mg/dL (ref 8.7–10.2)
CO2: 24 mmol/L (ref 20–29)
CREATININE: 1.05 mg/dL (ref 0.76–1.27)
Chloride: 104 mmol/L (ref 96–106)
GFR calc Af Amer: 90 mL/min/{1.73_m2} (ref >59)
GFR, EST NON AFRICAN AMERICAN: 78 mL/min/{1.73_m2} (ref >59)
GLOBULIN, TOTAL: 2.7 g/dL (ref 1.5–4.5)
GLUCOSE: 100 mg/dL — AB (ref 65–99)
Potassium: 4.5 mmol/L (ref 3.5–5.2)
SODIUM: 142 mmol/L (ref 134–144)
Total Protein: 7.3 g/dL (ref 6.0–8.5)

## 2017-10-10 LAB — CBC WITH DIFFERENTIAL/PLATELET
BASOS ABS: 0 10*3/uL (ref 0.0–0.2)
Basos: 0 %
EOS (ABSOLUTE): 0.2 10*3/uL (ref 0.0–0.4)
EOS: 3 %
HEMATOCRIT: 44.7 % (ref 37.5–51.0)
HEMOGLOBIN: 14.9 g/dL (ref 13.0–17.7)
IMMATURE GRANULOCYTES: 0 %
Immature Grans (Abs): 0 10*3/uL (ref 0.0–0.1)
LYMPHS: 19 %
Lymphocytes Absolute: 1.2 10*3/uL (ref 0.7–3.1)
MCH: 30.5 pg (ref 26.6–33.0)
MCHC: 33.3 g/dL (ref 31.5–35.7)
MCV: 91 fL (ref 79–97)
MONOCYTES: 7 %
Monocytes Absolute: 0.5 10*3/uL (ref 0.1–0.9)
Neutrophils Absolute: 4.5 10*3/uL (ref 1.4–7.0)
Neutrophils: 71 %
Platelets: 225 10*3/uL (ref 150–379)
RBC: 4.89 x10E6/uL (ref 4.14–5.80)
RDW: 13.5 % (ref 12.3–15.4)
WBC: 6.4 10*3/uL (ref 3.4–10.8)

## 2017-10-10 LAB — HIV ANTIBODY (ROUTINE TESTING W REFLEX): HIV SCREEN 4TH GENERATION: NONREACTIVE

## 2017-10-11 ENCOUNTER — Telehealth (INDEPENDENT_AMBULATORY_CARE_PROVIDER_SITE_OTHER): Payer: Self-pay | Admitting: Physician Assistant

## 2017-10-11 ENCOUNTER — Telehealth (INDEPENDENT_AMBULATORY_CARE_PROVIDER_SITE_OTHER): Payer: Self-pay

## 2017-10-11 NOTE — Telephone Encounter (Signed)
-----   Message from Loletta Specteroger David Gomez, PA-C sent at 10/10/2017  6:30 PM EST ----- Normal labs.

## 2017-10-11 NOTE — Telephone Encounter (Signed)
Pt was inform of the lab result, I was auth by the nurse to informed the pt

## 2017-10-11 NOTE — Telephone Encounter (Signed)
Left patient a message informing of normal labs. And to call office with any questions or concerns. Maryjean Mornempestt S Roberts, CMA

## 2017-11-13 ENCOUNTER — Ambulatory Visit: Payer: Self-pay | Admitting: Nurse Practitioner

## 2017-11-13 ENCOUNTER — Ambulatory Visit: Payer: Self-pay | Attending: Internal Medicine

## 2017-12-04 ENCOUNTER — Encounter (INDEPENDENT_AMBULATORY_CARE_PROVIDER_SITE_OTHER): Payer: Self-pay | Admitting: Physician Assistant

## 2017-12-04 ENCOUNTER — Other Ambulatory Visit: Payer: Self-pay

## 2017-12-04 ENCOUNTER — Ambulatory Visit (INDEPENDENT_AMBULATORY_CARE_PROVIDER_SITE_OTHER): Payer: Self-pay | Admitting: Physician Assistant

## 2017-12-04 VITALS — BP 119/84 | HR 93 | Temp 97.7°F | Wt 167.2 lb

## 2017-12-04 DIAGNOSIS — Z1159 Encounter for screening for other viral diseases: Secondary | ICD-10-CM

## 2017-12-04 DIAGNOSIS — Z23 Encounter for immunization: Secondary | ICD-10-CM

## 2017-12-04 DIAGNOSIS — M25561 Pain in right knee: Secondary | ICD-10-CM

## 2017-12-04 DIAGNOSIS — Z1211 Encounter for screening for malignant neoplasm of colon: Secondary | ICD-10-CM

## 2017-12-04 DIAGNOSIS — F1490 Cocaine use, unspecified, uncomplicated: Secondary | ICD-10-CM

## 2017-12-04 DIAGNOSIS — M62522 Muscle wasting and atrophy, not elsewhere classified, left upper arm: Secondary | ICD-10-CM

## 2017-12-04 DIAGNOSIS — G8929 Other chronic pain: Secondary | ICD-10-CM

## 2017-12-04 DIAGNOSIS — M25562 Pain in left knee: Secondary | ICD-10-CM

## 2017-12-04 DIAGNOSIS — F1010 Alcohol abuse, uncomplicated: Secondary | ICD-10-CM

## 2017-12-04 MED ORDER — NAPROXEN 500 MG PO TABS: 500.0000 mg | ORAL_TABLET | Freq: Two times a day (BID) | ORAL | 1 refills | Status: DC

## 2017-12-04 NOTE — Progress Notes (Signed)
Subjective:  Patient ID: Donald Salas, male    DOB: 1961/06/09  Age: 57 y.o. MRN: [id number]  CC: f/u pain  HPI  Donald Salas is a 58 y.o. male with a medical history of alcohol abuse, crack cocaine abuse, left arm weakness, and muscle mass of leg presents as a new patient with complaint of left arm weakness and "locking". Seen by Dr. Delrae Alfred on 12/30/15 for similar symptoms and was found to have extensive left arm atrophy and weakness. She ordered C spine XR and MRI but patient did not have funds or financial assistance to have imaging done. Pt says he is trying to complete applications for financial assistance and has not been approved yet.   Pt still drinking alcohol and using crack/cocaine. Says his nephew supplies him with crack for free. Says he is trying to reduce amount of alcohol/crack he consumes but is not ready to quit entirely. Likes using crack, says he is used to it and has been using crack for more than 25 years. Says he would smoke crack everyday if he could.     Outpatient Medications Prior to Visit  Medication Sig Dispense Refill  . naproxen (NAPROSYN) 500 MG tablet Take 1 tablet (500 mg total) by mouth 2 (two) times daily with a meal. (Patient not taking: Reported on 12/04/2017) 30 tablet 0   No facility-administered medications prior to visit.      ROS Review of Systems  Constitutional: Negative for chills, fever and malaise/fatigue.  Eyes: Negative for blurred vision.  Respiratory: Negative for shortness of breath.   Cardiovascular: Negative for chest pain and palpitations.  Gastrointestinal: Negative for abdominal pain and nausea.  Genitourinary: Negative for dysuria and hematuria.  Musculoskeletal: Positive for joint pain. Negative for myalgias.  Skin: Negative for rash.  Neurological: Positive for focal weakness. Negative for tingling and headaches.  Psychiatric/Behavioral: Negative for depression. The patient is not nervous/anxious.     Objective:  BP  119/84 (BP Location: Left Arm, Patient Position: Sitting, Cuff Size: Normal)   Pulse 93   Temp 97.7 F (36.5 C) (Oral)   Wt 167 lb 3.2 oz (75.8 kg)   SpO2 100%   BMI 24.48 kg/m   BP/Weight 12/04/2017 10/09/2017 09/27/2017  Systolic BP 119 155 121  Diastolic BP 84 94 74  Wt. (Lbs) 167.2 167.2 160  BMI 24.48 24.48 23.63      Physical Exam  Constitutional: He is oriented to person, place, and time.  Thin, NAD, polite  HENT:  Head: Normocephalic and atraumatic.  Eyes: No scleral icterus.  Neck: Normal range of motion. Neck supple. No thyromegaly present.  Cardiovascular: Normal rate, regular rhythm and normal heart sounds.  Pulmonary/Chest: Effort normal and breath sounds normal. No respiratory distress.  Abdominal: Soft. Bowel sounds are normal. There is no tenderness.  Musculoskeletal: He exhibits no edema.  LUE and LLE with impaired aROM  Neurological: He is alert and oriented to person, place, and time. No cranial nerve deficit. Coordination (upper extremities and lower extremities: left worse than right) abnormal.  Partial paresis of LUE and LLE with strength 4/5 on upper and lower extremities bilaterally  Skin: Skin is warm and dry. No rash noted. No erythema. No pallor.  Psychiatric: He has a normal mood and affect. His behavior is normal. Thought content normal.  Vitals reviewed.    Assessment & Plan:    1. Alcohol abuse - Not ready to quit  2. Crack cocaine use - Not ready to quit. 25+  years of use.  3. Atrophy of muscle of left upper arm - Needs neuro eval and rehab. Unfortunately patient is still in the process of obtaining financial assistance.  4. Chronic pain of both knees - Refill naproxen (NAPROSYN) 500 MG tablet; Take 1 tablet (500 mg total) by mouth 2 (two) times daily with a meal.  Dispense: 30 tablet; Refill: 1  5. Encounter for hepatitis C screening test for low risk patient - Hepatitis c antibody (reflex)  6. Need for Tdap vaccination - Tdap  vaccine greater than or equal to 7yo IM  7. Need for prophylactic vaccination and inoculation against influenza - Flu Vaccine QUAD 6+ mos PF IM (Fluarix Quad PF)  8. Special screening for malignant neoplasms, colon - Fecal occult blood, imunochemical. Collected in clinic today.    Meds ordered this encounter  Medications  . naproxen (NAPROSYN) 500 MG tablet    Sig: Take 1 tablet (500 mg total) by mouth 2 (two) times daily with a meal.    Dispense:  30 tablet    Refill:  1    Order Specific Question:   Supervising Provider    Answer:   Quentin AngstJEGEDE, OLUGBEMIGA E [[id number]]    Follow-up: No Follow-up on file.   Loletta Specteroger David Ianna Salmela PA

## 2017-12-05 ENCOUNTER — Telehealth (INDEPENDENT_AMBULATORY_CARE_PROVIDER_SITE_OTHER): Payer: Self-pay

## 2017-12-05 LAB — HEPATITIS C ANTIBODY (REFLEX)

## 2017-12-05 LAB — HCV COMMENT:

## 2017-12-05 NOTE — Telephone Encounter (Signed)
-----   Message from Loletta Specteroger David Gomez, PA-C sent at 12/05/2017 10:08 AM EDT ----- HCV negative.

## 2017-12-05 NOTE — Telephone Encounter (Signed)
Patient is aware of negative Hep C. Maryjean Mornempestt S Roberts, CMA

## 2017-12-06 ENCOUNTER — Telehealth (INDEPENDENT_AMBULATORY_CARE_PROVIDER_SITE_OTHER): Payer: Self-pay

## 2017-12-06 LAB — FECAL OCCULT BLOOD, IMMUNOCHEMICAL: FECAL OCCULT BLD: NEGATIVE

## 2017-12-06 NOTE — Telephone Encounter (Signed)
Patients aunt given results of negative Hep C and FIT she will relay to the patient. Maryjean Mornempestt S Roberts, CMA

## 2017-12-06 NOTE — Telephone Encounter (Signed)
-----   Message from Loletta Specteroger David Gomez, PA-C sent at 12/06/2017  1:37 PM EDT ----- FIT and HCV negative.

## 2017-12-18 ENCOUNTER — Telehealth (INDEPENDENT_AMBULATORY_CARE_PROVIDER_SITE_OTHER): Payer: Self-pay | Admitting: Physician Assistant

## 2017-12-18 NOTE — Telephone Encounter (Signed)
Pt came to the office to inform the pcp, that he has been approve for the [employer] discount and you can sent any referral he need it.

## 2017-12-20 ENCOUNTER — Other Ambulatory Visit (INDEPENDENT_AMBULATORY_CARE_PROVIDER_SITE_OTHER): Payer: Self-pay | Admitting: Physician Assistant

## 2017-12-20 DIAGNOSIS — M62522 Muscle wasting and atrophy, not elsewhere classified, left upper arm: Secondary | ICD-10-CM

## 2017-12-20 NOTE — Telephone Encounter (Signed)
Thank you. Please tell him I have sent referral for neurology

## 2017-12-21 NOTE — Telephone Encounter (Signed)
Pt was informed.

## 2018-01-03 ENCOUNTER — Telehealth: Payer: Self-pay | Admitting: Neurology

## 2018-01-03 ENCOUNTER — Ambulatory Visit (INDEPENDENT_AMBULATORY_CARE_PROVIDER_SITE_OTHER): Payer: Self-pay | Admitting: Neurology

## 2018-01-03 ENCOUNTER — Encounter: Payer: Self-pay | Admitting: Neurology

## 2018-01-03 VITALS — BP 132/89 | HR 98 | Wt 160.5 lb

## 2018-01-03 DIAGNOSIS — R269 Unspecified abnormalities of gait and mobility: Secondary | ICD-10-CM | POA: Insufficient documentation

## 2018-01-03 DIAGNOSIS — M542 Cervicalgia: Secondary | ICD-10-CM

## 2018-01-03 NOTE — Telephone Encounter (Signed)
Noted  

## 2018-01-03 NOTE — Telephone Encounter (Signed)
Please schedule his MRI cervical spine soon

## 2018-01-03 NOTE — Telephone Encounter (Signed)
Per pt has cone assistance order sent to [facility] they will reach out to the pt to schedule.

## 2018-01-03 NOTE — Progress Notes (Signed)
PATIENT: Donald Salas DOB: March 04, 1961  Chief Complaint  Patient presents with  . Extremity Weakness    Reports weakness, pain and stiffness in all four extremities.  His symptoms started in his left arm over two years ago and have been progressing since that time.  He is having frequent falls.    [name] PCP    [name], PA-C     HISTORICAL  Donald Salas is a 57 years old male, seen in refer by his primary care PA [name], for evaluation of weakness, gait abnormality, initial evaluation was on January 03, 2018.  Patient came in alone, was driven by his aunt, he had a history of alcohol, daily cocaine abuse,  He reported since 2018, he had a gradual onset gait abnormality, bilateral arm paresthesia and weakness, neck pain, radiating pain to bilateral shoulder, upper extremity, more on the left side, mainly involving left middle 3 fingers,  Symptoms gradually getting worse, now he can read his right arm overhead, can not raise left arm overhead anymore, gradual worsening gait abnormality, fell few times, he denies bowel and bladder incontinence.  I personally reviewed CT head without contrast in January 2019, generalized atrophy supratentorium small vessel disease no acute abnormality  Laboratory evaluation in 2019, negative hepatitis C, HIV, normal CBC, CMP, A1c was 5.4, UDS was positive for cocaine    REVIEW OF SYSTEMS: Full 14 system review of systems performed and notable only for Blurred vision, feeling hot, joint pain, cramps, aching muscles, numbness, weakness, dizziness, passing out, sleepiness, snoring, restless legs, not enough sleep, decreased energy  ALLERGIES: No Known Allergies  HOME MEDICATIONS: Current Outpatient Medications  Medication Sig Dispense Refill  . naproxen (NAPROSYN) 500 MG tablet Take 1 tablet (500 mg total) by mouth 2 (two) times daily with a meal. 30 tablet 1   No current facility-administered medications for this visit.      PAST MEDICAL HISTORY: Past Medical History:  Diagnosis Date  . Alcohol addiction (HCC)   . Closed left clavicular fracture when in middle school  . Drug addiction (HCC)   . Weakness     PAST SURGICAL HISTORY: Past Surgical History:  Procedure Laterality Date  . Right Leg Laceration Repair     Local anesthetic only    FAMILY HISTORY: Family History  Problem Relation Age of Onset  . Breast cancer Mother   . Other Father        unknown history  . Asthma Brother   . Eczema Brother     SOCIAL HISTORY:  Social History   Socioeconomic History  . Marital status: Single    Spouse name: Not on file  . Number of children: 5  . Years of education: 9  . Highest education level: High school graduate  Occupational History  . Occupation: Unemployed  Social Needs  . Financial resource strain: Not on file  . Food insecurity:    Worry: Not on file    Inability: Not on file  . Transportation needs:    Medical: Not on file    Non-medical: Not on file  Tobacco Use  . Smoking status: Current Every Day Smoker    Packs/day: 1.00    Years: 79.00    Pack years: 79.00    Types: Cigarettes    Start date: 09/09/1969  . Smokeless tobacco: Never Used  Substance and Sexual Activity  . Alcohol use: Yes    Alcohol/week: 0.0 oz    Comment: 1-4 - 40oz  malt liquor per day  . Drug use: Yes    Types: Cocaine    Comment: Crack cocaine every day--smokes, Rare MJ.  Was in prison 4-5 years ago for 4 months, but started using after released.  No treatment otherwise.    [name] Sexual activity: Never  Lifestyle  . Physical activity:    Days per week: Not on file    Minutes per session: Not on file  . Stress: Not on file  Relationships  . Social connections:    Talks on phone: Not on file    Gets together: Not on file    Attends religious service: Not on file    Active member of club or organization: Not on file    Attends meetings of clubs or organizations: Not on file    Relationship  status: Not on file  . Intimate partner violence:    Fear of current or ex partner: Not on file    Emotionally abused: Not on file    Physically abused: Not on file    Forced sexual activity: Not on file  Other Topics Concern  . Not on file  Social History Narrative   Born and raised in [place].   Graduated from [employer].   Was in [employer]	[place].     Started drugs with different women he was with throughout life.    Lives alone in a house.  Not clear how he supports his habits.    Right-handed.   No caffeine use.     PHYSICAL EXAM   Vitals:   01/03/18 0943  BP: 132/89  Pulse: 98  Weight: 160 lb 8 oz (72.8 kg)    Not recorded      Body mass index is 23.5 kg/m.  PHYSICAL EXAMNIATION:  Gen: NAD, conversant, well nourised, obese, well groomed                     Cardiovascular: Regular rate rhythm, no peripheral edema, warm, nontender. Eyes: Conjunctivae clear without exudates or hemorrhage Neck: Supple, no carotid bruits. Pulmonary: Clear to auscultation bilaterally   NEUROLOGICAL EXAM:  MENTAL STATUS: Speech:    Speech is normal; fluent and spontaneous with normal comprehension.  Cognition:     Orientation to time, place and person     Normal recent and remote memory     Normal Attention span and concentration     Normal Language, naming, repeating,spontaneous speech     Fund of knowledge   CRANIAL NERVES: CN II: Visual fields are full to confrontation.  Pupils are round equal and briskly reactive to light. CN III, IV, VI: extraocular movement are normal. No ptosis. CN V: Facial sensation is intact to pinprick in all 3 divisions bilaterally. Corneal responses are intact.  CN VII: Face is symmetric with normal eye closure and smile. CN VIII: Hearing is normal to rubbing fingers CN IX, X: Palate elevates symmetrically. Phonation is normal. CN XI: Head turning and shoulder shrug are intact CN XII: Tongue is midline with normal movements and no atrophy.   endentured  MOTOR:  Mild bilateral lower extremity spasticity R/L: Shoulder abduction 4/3, external rotation 4/3, elbow flexion 4/3, extension 5-/4, wrist flexion 5-/4, extension 5-/4, grip 5-/4  Hip flexion 4/4, knee extension 5/5, knee flexion 5/5-, ankle dorsiflexion5/5  REFLEXES: Reflexes are 3 and symmetric at the biceps, triceps, knees, and ankles. Plantar responses are extensor bilaterally  SENSORY: Intact to light touch, pinprick, positional sensation and vibratory sensation are intact in fingers and  toes.  COORDINATION: Rapid alternating movements and fine finger movements are intact. There is no dysmetria on finger-to-nose and heel-knee-shin.    GAIT/STANCE: He needs pushed up to get up from seated position, stiff, unsteady, difficulty bending right leg.  DIAGNOSTIC DATA (LABS, IMAGING, TESTING) - I reviewed patient records, labs, notes, testing and imaging myself where available.   ASSESSMENT AND PLAN  Lanae CrumblySamuel L Schrager is a 57 y.o. male   Gait abnormality Neck pain, hyperreflexia, History of polysubstance abuse, alcohol, cocaine use daily  Most worrisome for cervical spondylitic myelopathy  Urgent MRI of cervical spine    Levert FeinsteinYijun Vendetta Pittinger, M.D. Ph.D.  [facility] [address], [address] [place], [place] [address] Ph: [phone] Fax: [phone]  CC: Loletta SpecterGomez, Roger David, PA-C

## 2018-01-11 ENCOUNTER — Ambulatory Visit
Admission: RE | Admit: 2018-01-11 | Discharge: 2018-01-11 | Disposition: A | Discharge status: Home / Self Care | Payer: No Typology Code available for payment source | Source: Ambulatory Visit | Attending: Neurology | Admitting: Neurology

## 2018-01-11 DIAGNOSIS — M542 Cervicalgia: Secondary | ICD-10-CM

## 2018-01-14 ENCOUNTER — Telehealth: Payer: Self-pay | Admitting: Neurology

## 2018-01-14 NOTE — Telephone Encounter (Signed)
I called the patient, no answer, MRI of the cervical spine shows multilevel spondylosis and neuroforaminal stenosis from C3-4 through C6-7 levels.  No evidence of myelopathy noted.  If the patient is having significant discomfort in the neck, epidural steroid injection may be of some benefit.   MRI cervical 01/14/18:  IMPRESSION: Abnormal MRI scan of the cervical spine showing prominent spondylitic changes seen throughout resulting in moderate canal stenosis from C3-4 to C6-7 with severe bilateral foraminal narrowing.

## 2018-01-15 NOTE — Telephone Encounter (Signed)
Left another message requesting a return call. 

## 2018-01-15 NOTE — Telephone Encounter (Signed)
Tried to reach patient again.  Left another message.

## 2018-01-15 NOTE — Telephone Encounter (Addendum)
Attempted another call to patient.  Rang repeatedly with no answer or machine.

## 2018-01-16 NOTE — Telephone Encounter (Signed)
Left further messages for him today.  If he calls back, we are happy to discuss his MRI results.  Otherwise, he has an appt on 02/13/18 with Dr. Terrace ArabiaYan and she will review it further with him then.

## 2018-01-16 NOTE — Telephone Encounter (Signed)
Pt's aunt called with the pt listening on her cell phone (she said his phone was out of order). Message read to pt and he decided to wait until 5/22 to disuss MRI results with Dr Terrace ArabiaYan.  FYI

## 2018-02-13 ENCOUNTER — Ambulatory Visit (INDEPENDENT_AMBULATORY_CARE_PROVIDER_SITE_OTHER): Payer: Self-pay | Admitting: Neurology

## 2018-02-13 ENCOUNTER — Encounter: Payer: Self-pay | Admitting: Neurology

## 2018-02-13 ENCOUNTER — Telehealth: Payer: Self-pay | Admitting: Neurology

## 2018-02-13 VITALS — BP 120/91 | HR 108 | Ht 69.5 in | Wt 156.5 lb

## 2018-02-13 DIAGNOSIS — R269 Unspecified abnormalities of gait and mobility: Secondary | ICD-10-CM

## 2018-02-13 DIAGNOSIS — G959 Disease of spinal cord, unspecified: Secondary | ICD-10-CM

## 2018-02-13 DIAGNOSIS — I679 Cerebrovascular disease, unspecified: Secondary | ICD-10-CM

## 2018-02-13 DIAGNOSIS — I672 Cerebral atherosclerosis: Secondary | ICD-10-CM

## 2018-02-13 MED ORDER — GABAPENTIN 300 MG PO CAPS: 300.0000 mg | ORAL_CAPSULE | Freq: Three times a day (TID) | ORAL | 11 refills | Status: DC

## 2018-02-13 MED FILL — GABAPENTIN 300 MG CAPSULE: 300 | 30 days supply | Qty: 90 | Fill #0

## 2018-02-13 NOTE — Telephone Encounter (Signed)
100% cone assistance order sent to [facility]. They will reach out to the pt to schedule.

## 2018-02-13 NOTE — Progress Notes (Signed)
PATIENT: Donald Salas DOB: 1961-07-23  Chief Complaint  Patient presents with  . Gait Abnormality    He is here with his aunt, Marca Ancona.  They woud like to review his cervical MRI and treatment options.     HISTORICAL  Donald Salas is a 57 years old male, seen in refer by his primary care PA [name], for evaluation of weakness, gait abnormality, initial evaluation was on January 03, 2018.  Patient came in alone, was driven by his aunt, he had a history of alcohol, daily cocaine abuse,  He reported since 2018, he had a gradual onset gait abnormality, bilateral arm paresthesia and weakness, neck pain, radiating pain to bilateral shoulder, upper extremity, more on the left side, mainly involving left middle 3 fingers,  Symptoms gradually getting worse, now he can read his right arm overhead, can not raise left arm overhead anymore, gradual worsening gait abnormality, fell few times, he denies bowel and bladder incontinence.  I personally reviewed CT head without contrast in January 2019, generalized atrophy supratentorium small vessel disease no acute abnormality  Laboratory evaluation in 2019, negative hepatitis C, HIV, normal CBC, CMP, A1c was 5.4, UDS was positive for cocaine  UPDATE Feb 13 2018: We have reviewed MRI scan of the cervical spine on January 11 2018:showing prominent spondylitic changes seen throughout resulting in moderate canal stenosis from C3-4 to C6-7 with severe bilateral foraminal narrowing, evidence of cord signal changes.  He complains of progressive worsening bilateral upper and lower extremity weakness, gait abnormality, paresthesia, he denies bowel and bladder incontinence, complains of intermittent bilateral upper and lower extremity muscle achy pain.   REVIEW OF SYSTEMS: Full 14 system review of systems performed and notable only for progressive worsening gait abnormality  ALLERGIES: No Known Allergies  HOME MEDICATIONS: Current  Outpatient Medications  Medication Sig Dispense Refill  . naproxen (NAPROSYN) 500 MG tablet Take 1 tablet (500 mg total) by mouth 2 (two) times daily with a meal. 30 tablet 1   No current facility-administered medications for this visit.     PAST MEDICAL HISTORY: Past Medical History:  Diagnosis Date  . Alcohol addiction (HCC)   . Closed left clavicular fracture when in middle school  . Drug addiction (HCC)   . Weakness     PAST SURGICAL HISTORY: Past Surgical History:  Procedure Laterality Date  . Right Leg Laceration Repair     Local anesthetic only    FAMILY HISTORY: Family History  Problem Relation Age of Onset  . Breast cancer Mother   . Other Father        unknown history  . Asthma Brother   . Eczema Brother     SOCIAL HISTORY:  Social History   Socioeconomic History  . Marital status: Single    Spouse name: Not on file  . Number of children: 5  . Years of education: 27  . Highest education level: High school graduate  Occupational History  . Occupation: Unemployed  Social Needs  . Financial resource strain: Not on file  . Food insecurity:    Worry: Not on file    Inability: Not on file  . Transportation needs:    Medical: Not on file    Non-medical: Not on file  Tobacco Use  . Smoking status: Current Every Day Smoker    Packs/day: 1.00    Years: 79.00    Pack years: 79.00    Types: Cigarettes    Start date: 09/09/1969  .  Smokeless tobacco: Never Used  Substance and Sexual Activity  . Alcohol use: Yes    Alcohol/week: 0.0 oz    Comment: 1-4 - 40oz malt liquor per day  . Drug use: Yes    Types: Cocaine    Comment: Crack cocaine every day--smokes, Rare MJ.  Was in prison 4-5 years ago for 4 months, but started using after released.  No treatment otherwise.    [name] Sexual activity: Never  Lifestyle  . Physical activity:    Days per week: Not on file    Minutes per session: Not on file  . Stress: Not on file  Relationships  . Social  connections:    Talks on phone: Not on file    Gets together: Not on file    Attends religious service: Not on file    Active member of club or organization: Not on file    Attends meetings of clubs or organizations: Not on file    Relationship status: Not on file  . Intimate partner violence:    Fear of current or ex partner: Not on file    Emotionally abused: Not on file    Physically abused: Not on file    Forced sexual activity: Not on file  Other Topics Concern  . Not on file  Social History Narrative   Born and raised in [place].   Graduated from [employer].   Was in [employer]	[place].     Started drugs with different women he was with throughout life.    Lives alone in a house.  Not clear how he supports his habits.    Right-handed.   No caffeine use.     PHYSICAL EXAM   Vitals:   02/13/18 1259  BP: (!) 120/91  Pulse: (!) 108  Weight: 156 lb 8 oz (71 kg)  Height: 5' 9.5" (1.765 m)    Not recorded      Body mass index is 22.78 kg/m.  PHYSICAL EXAMNIATION:  Gen: NAD, conversant, well nourised, obese, well groomed                     Cardiovascular: Regular rate rhythm, no peripheral edema, warm, nontender. Eyes: Conjunctivae clear without exudates or hemorrhage Neck: Supple, no carotid bruits. Pulmonary: Clear to auscultation bilaterally   NEUROLOGICAL EXAM:  MENTAL STATUS: Speech:    Speech is normal; fluent and spontaneous with normal comprehension.  Cognition:     Orientation to time, place and person     Normal recent and remote memory     Normal Attention span and concentration     Normal Language, naming, repeating,spontaneous speech     Fund of knowledge   CRANIAL NERVES: CN II: Visual fields are full to confrontation.  Pupils are round equal and briskly reactive to light. CN III, IV, VI: extraocular movement are normal. No ptosis. CN V: Facial sensation is intact to pinprick in all 3 divisions bilaterally. Corneal responses are intact.  CN VII:  Face is symmetric with normal eye closure and smile. CN VIII: Hearing is normal to rubbing fingers CN IX, X: Palate elevates symmetrically. Phonation is normal. CN XI: Head turning and shoulder shrug are intact CN XII: Tongue is midline with normal movements and no atrophy.  endentured  MOTOR:  Mild bilateral lower extremity spasticity R/L: Shoulder abduction 4/3, external rotation 4/3, elbow flexion 4/1, extension 5-/4, wrist flexion 5-/4, extension 5-/4, grip 5-/4, significant bilateral intrinsic hand muscle atrophy, bilateral finger abduction 3/3  Hip flexion  4/4, knee extension 5/5, knee flexion 5/5-, ankle dorsiflexion5/5  REFLEXES: Reflexes are 3 and symmetric at the biceps, triceps, knees, and ankles. Plantar responses are extensor bilaterally, nonsustained right ankle clonus  SENSORY: Intact to light touch, pinprick, positional sensation and vibratory sensation are intact in fingers and toes.  COORDINATION: Rapid alternating movements and fine finger movements are intact. There is no dysmetria on finger-to-nose and heel-knee-shin.    GAIT/STANCE: He needs pushed up to get up from seated position, stiff, unsteady, drag his right leg, difficulty bending right leg.  DIAGNOSTIC DATA (LABS, IMAGING, TESTING) - I reviewed patient records, labs, notes, testing and imaging myself where available.   ASSESSMENT AND PLAN  Donald Salas is a 57 y.o. male   Cervical spondylitic myelopathy History of polysubstance abuse, alcohol, cocaine use daily  Significant abnormal MRI of cervical spine,  Refer him to neurosurgeon for evaluation  Cerebrovascular disease  Complete evaluation with MRI of the brain, ultrasound of carotid artery, echocardiogram,    Levert Feinstein, M.D. Ph.D.  [facility] [address] [place], [place] [address] Ph: [phone] Fax: [phone]  CC: [name], PA-C

## 2018-02-21 ENCOUNTER — Ambulatory Visit (HOSPITAL_COMMUNITY): Payer: No Typology Code available for payment source | Attending: Cardiovascular Disease

## 2018-02-21 ENCOUNTER — Other Ambulatory Visit: Payer: Self-pay

## 2018-02-21 ENCOUNTER — Telehealth: Payer: Self-pay | Admitting: Neurology

## 2018-02-21 DIAGNOSIS — F1010 Alcohol abuse, uncomplicated: Secondary | ICD-10-CM | POA: Insufficient documentation

## 2018-02-21 DIAGNOSIS — I639 Cerebral infarction, unspecified: Secondary | ICD-10-CM

## 2018-02-21 DIAGNOSIS — I672 Cerebral atherosclerosis: Secondary | ICD-10-CM | POA: Insufficient documentation

## 2018-02-21 DIAGNOSIS — I679 Cerebrovascular disease, unspecified: Secondary | ICD-10-CM | POA: Insufficient documentation

## 2018-02-21 DIAGNOSIS — R269 Unspecified abnormalities of gait and mobility: Secondary | ICD-10-CM | POA: Insufficient documentation

## 2018-02-21 DIAGNOSIS — Z720 Tobacco use: Secondary | ICD-10-CM | POA: Insufficient documentation

## 2018-02-21 DIAGNOSIS — G959 Disease of spinal cord, unspecified: Secondary | ICD-10-CM | POA: Insufficient documentation

## 2018-02-21 DIAGNOSIS — I509 Heart failure, unspecified: Secondary | ICD-10-CM

## 2018-02-21 NOTE — Telephone Encounter (Signed)
Left message with his aunt (on [place]) to call me back.  Unable to reach patient at his number.

## 2018-02-21 NOTE — Telephone Encounter (Signed)
Please call patient, echocardiogram showed decreased ejection fraction 25 to 30%, diffuse hypokinesis, consistent with congestive heart failure, systolic function was severely reduced, he should see his cardiologist for further evaluation,  Ultrasound of carotid artery was ordered, but I did not see report, order was reentered, make sure it is complete,   Left ventricle: The cavity size was moderately dilated. Systolic function was severely reduced. The estimated ejection fraction was in the range of 25% to 30%. Diffuse hypokinesis. Features are consistent with a pseudonormal left ventricular filling pattern, with concomitant abnormal relaxation and increased filling pressure (grade 2 diastolic dysfunction).

## 2018-02-22 ENCOUNTER — Other Ambulatory Visit: Payer: Self-pay | Admitting: *Deleted

## 2018-02-22 DIAGNOSIS — I639 Cerebral infarction, unspecified: Secondary | ICD-10-CM

## 2018-02-22 NOTE — Telephone Encounter (Signed)
I was able to reach his aunt, Marca Ancona, who is on his DPR [phone]).  She is aware of his ECHO results and will have him contact his cardiologist.  Additionally, she will let him know to expect a call to get his carotid ultrasound scheduled.  I provided our number to call back with any questions.  She will be speaking with him today about this information.

## 2018-02-25 ENCOUNTER — Ambulatory Visit (HOSPITAL_COMMUNITY): Admission: RE | Admit: 2018-02-25 | Payer: Self-pay | Source: Ambulatory Visit

## 2018-02-25 NOTE — Telephone Encounter (Signed)
Ok, per vo by Dr. Terrace ArabiaYan, to place cardiology referral.

## 2018-02-25 NOTE — Addendum Note (Signed)
Addended by: Lindell SparKIRKMAN, MICHELLE C on: 02/25/2018 11:29 AM   Modules accepted: Orders

## 2018-02-25 NOTE — Telephone Encounter (Signed)
Referral has been sent.

## 2018-02-25 NOTE — Telephone Encounter (Signed)
Patient's aunt calling stating patient does not have a cardiologist and would need a referral.

## 2018-02-26 ENCOUNTER — Ambulatory Visit (HOSPITAL_COMMUNITY)
Admission: RE | Admit: 2018-02-26 | Discharge: 2018-02-26 | Disposition: A | Discharge status: Home / Self Care | Payer: Self-pay | Source: Ambulatory Visit | Attending: Neurology | Admitting: Neurology

## 2018-02-26 DIAGNOSIS — I639 Cerebral infarction, unspecified: Secondary | ICD-10-CM | POA: Insufficient documentation

## 2018-02-26 NOTE — Progress Notes (Signed)
Carotid duplex prelim: 1-39% ICA stenosis.  Gertrue Willette Eunice, RDMS, RVT   

## 2018-02-27 ENCOUNTER — Telehealth: Payer: Self-pay | Admitting: *Deleted

## 2018-02-27 NOTE — Telephone Encounter (Signed)
-----   Message from Levert FeinsteinYijun Yan, MD sent at 02/27/2018 11:35 AM EDT ----- Please call patient: Ultrasound of carotid arteries showed less than 39% stenosis bilaterally, vertebral artery with retrograde flow.

## 2018-02-27 NOTE — Progress Notes (Signed)
Correction: Bilateral vertebral arteries were antegrade flow

## 2018-02-27 NOTE — Telephone Encounter (Signed)
Spoke to his aunt, Marca AnconaBetty [place], [place].  She is aware of results and verbalized understanding.

## 2018-03-12 NOTE — Telephone Encounter (Signed)
Called and spoke to miss Kathie RhodesBetty apt [facility]  This Thursday arrive at 2:20 for 2:40 apt  With Dr. Antoine PocheHochrein.

## 2018-03-12 NOTE — Telephone Encounter (Signed)
Pts aunt requesting a call to dsicuss setting up an appt with the "heart doctor since we have gotten the results back from the ultrasound"

## 2018-03-13 NOTE — Progress Notes (Signed)
Cardiology Office Note   Date:  03/14/2018   ID:  Donald Salas, DOB 1961-02-06, MRN [id number]  PCP:  [name], PA-C  Cardiologist:   No primary care provider on file. Referring:       Chief Complaint  Patient presents with  . Cardiomyopathy      History of Present Illness: Donald Salas is a 57 y.o. male who presents for evaluation of an abnormal echo.   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  An echocardiogram was ordered and he is found  have an EF of 25 - 30% with global hypokinesis.   He has no past cardiac history.  He has had progressive weakness in his legs and muscle loss particularly in his left arm.  He has had problems with gait.  However, he denies any cardiovascular symptoms.  He says he even rides a bicycle which I find hard to imagine given his gait disturbances and balance issues.  When he is pedaling a bicycle he denies any shortness of breath, PND or orthopnea.  He has no palpitations, presyncope or syncope.  He has no weight gain or edema.  He drinks 4 to 5 40 ounce beers a day.     Past Medical History:  Diagnosis Date  . Alcohol addiction (HCC)   . Closed left clavicular fracture when in middle school  . Drug addiction (HCC)   . Weakness     Past Surgical History:  Procedure Laterality Date  . LEG SURGERY     left chainsaw accident     Current Outpatient Medications  Medication Sig Dispense Refill  . metoprolol succinate (TOPROL XL) 25 MG 24 hr tablet Take 1 tablet (25 mg total) by mouth daily. 90 tablet 3   No current facility-administered medications for this visit.     Allergies:   Patient has no known allergies.    Social History:  The patient  reports that he has been smoking cigarettes.  He started smoking about 48 years ago. He has a 79.00 pack-year smoking history. He has never used smokeless tobacco. He reports that he drinks alcohol. He  reports that he has current or past drug history. Drug: Cocaine.   Family History:  The patient's family history includes Asthma in his brother; Breast cancer in his mother; Eczema in his brother; Other in his father.    ROS:  Please see the history of present illness.   Otherwise, review of systems are positive for none.   All other systems are reviewed and negative.    PHYSICAL EXAM: VS:  BP 115/82 (BP Location: Right Arm)   Pulse (!) 103   Ht 5' 9.5" (1.765 m)   Wt 157 lb 9.6 oz (71.5 kg)   BMI 22.94 kg/m  , BMI Body mass index is 22.94 kg/m. GENERAL:  Frail appearing HEENT:  Pupils equal round and reactive, fundi not visualized, oral mucosa unremarkable, poor dentition NECK:  No jugular venous distention, waveform within normal limits, carotid upstroke brisk and symmetric, no bruits, no thyromegaly LYMPHATICS:  No cervical, inguinal adenopathy LUNGS:  Clear to auscultation bilaterally BACK:  No CVA tenderness CHEST:  Unremarkable HEART:  PMI not displaced or sustained,S1 and S2 within normal limits, no S3, no S4, no clicks, no rubs, no murmurs ABD:  Flat, positive bowel sounds normal in frequency in pitch, no bruits, no rebound, no guarding, no midline pulsatile  mass, no hepatomegaly, no splenomegaly EXT:  2 plus pulses throughout, no edema, no cyanosis no clubbing SKIN:  No rashes no nodules NEURO:  Cranial nerves II through XII grossly intact, motor grossly intact throughout, weak legs and left arm muscle wasting and partial paralysis  PSYCH:  Cognitively intact, oriented to person place and time    EKG:  EKG is ordered today. The ekg ordered today demonstrates sinus rhythm, rate 103, axis within normal limits, intervals within normal limits, left ventricular hypertrophy with repolarization changes.   Recent Labs: 10/09/2017: ALT 22; BUN 11; Creatinine, Ser 1.05; Hemoglobin 14.9; Platelets 225; Potassium 4.5; Sodium 142    Lipid Panel    Component Value Date/Time    CHOL 187 12/30/2015 1610   TRIG 87 12/30/2015 1610   HDL 75 12/30/2015 1610   LDLCALC 95 12/30/2015 1610      Wt Readings from Last 3 Encounters:  03/14/18 157 lb 9.6 oz (71.5 kg)  02/13/18 156 lb 8 oz (71 kg)  01/03/18 160 lb 8 oz (72.8 kg)      Other studies Reviewed: Additional studies/ records that were reviewed today include: Echo. Review of the above records demonstrates:  Please see elsewhere in the note.     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    He needs to stop drinking alcohol we talked about this.  In the start Toprol-XL 25 mg daily.  He can come back when he has his study and have med titration.  MUSCLE WASTING: He is being evaluated as above but of note he stopped taking his medications because it was not making him feel any different.   Current medicines are reviewed at length with the patient today.  The patient does not have concerns regarding medicines.  The following changes have been made:  As above  Labs/ tests ordered today include:   Orders Placed This Encounter  Procedures  . MYOCARDIAL PERFUSION IMAGING  . EKG 12-Lead     Disposition:   FU with APP in a couple of weeks.     Signed, Rollene RotundaJames Oneda Duffett, MD  03/14/2018 3:59 PM     [facility]

## 2018-03-14 ENCOUNTER — Encounter: Payer: Self-pay | Admitting: Cardiology

## 2018-03-14 ENCOUNTER — Ambulatory Visit (INDEPENDENT_AMBULATORY_CARE_PROVIDER_SITE_OTHER): Payer: No Typology Code available for payment source | Admitting: Cardiology

## 2018-03-14 VITALS — BP 115/82 | HR 103 | Ht 69.5 in | Wt 157.6 lb

## 2018-03-14 DIAGNOSIS — I429 Cardiomyopathy, unspecified: Secondary | ICD-10-CM

## 2018-03-14 MED ORDER — METOPROLOL SUCCINATE ER 25 MG PO TB24: 25.0000 mg | ORAL_TABLET | Freq: Every day | ORAL | 3 refills | Status: DC

## 2018-03-14 MED FILL — METOPROLOL SUCCINATE ER 25: 25 | 30 days supply | Qty: 30 | Fill #0

## 2018-03-14 NOTE — Patient Instructions (Signed)
Medication Instructions:  START- Toprol XL 25 mg daily  If you need a refill on your cardiac medications before your next appointment, please call your pharmacy.  Labwork: None Ordered   Testing/Procedures: Your physician has requested that you have a lexiscan myoview. For further information please visit [email or web address]. Please follow instruction sheet, as given.   Follow-Up: Your physician wants you to follow-up in: with PA same day as Stress Test.      Thank you for choosing [facility] at [facility]!!

## 2018-03-29 ENCOUNTER — Telehealth (HOSPITAL_COMMUNITY): Payer: Self-pay

## 2018-03-29 NOTE — Telephone Encounter (Signed)
Encounter complete. 

## 2018-04-03 ENCOUNTER — Ambulatory Visit (INDEPENDENT_AMBULATORY_CARE_PROVIDER_SITE_OTHER): Payer: No Typology Code available for payment source | Admitting: Cardiology

## 2018-04-03 ENCOUNTER — Ambulatory Visit (HOSPITAL_COMMUNITY)
Admission: RE | Admit: 2018-04-03 | Discharge: 2018-04-03 | Disposition: A | Discharge status: Home / Self Care | Payer: No Typology Code available for payment source | Source: Ambulatory Visit | Attending: Cardiology | Admitting: Cardiology

## 2018-04-03 ENCOUNTER — Encounter: Payer: Self-pay | Admitting: Cardiology

## 2018-04-03 DIAGNOSIS — I429 Cardiomyopathy, unspecified: Secondary | ICD-10-CM

## 2018-04-03 DIAGNOSIS — F1010 Alcohol abuse, uncomplicated: Secondary | ICD-10-CM

## 2018-04-03 DIAGNOSIS — G959 Disease of spinal cord, unspecified: Secondary | ICD-10-CM

## 2018-04-03 LAB — MYOCARDIAL PERFUSION IMAGING
CHL CUP NUCLEAR SRS: 3
CHL CUP NUCLEAR SSS: 4
LV dias vol: 321 mL (ref 62–150)
LV sys vol: 266 mL
NUC STRESS TID: 1.12
Peak HR: 93 {beats}/min
Rest HR: 80 {beats}/min
SDS: 1

## 2018-04-03 MED ORDER — TECHNETIUM TC 99M TETROFOSMIN IV KIT
30.1000 | PACK | Freq: Once | INTRAVENOUS | Status: AC | PRN
  Administered 2018-04-03: 30.1 via INTRAVENOUS
  Filled 2018-04-03: qty 31

## 2018-04-03 MED ORDER — TECHNETIUM TC 99M TETROFOSMIN IV KIT
9.2100 | PACK | Freq: Once | INTRAVENOUS | Status: AC | PRN
  Administered 2018-04-03: 9.21 via INTRAVENOUS
  Filled 2018-04-03: qty 10

## 2018-04-03 MED ORDER — LISINOPRIL 10 MG PO TABS: 10.0000 mg | ORAL_TABLET | Freq: Every day | ORAL | 3 refills | Status: DC

## 2018-04-03 MED ORDER — REGADENOSON 0.4 MG/5ML IV SOLN
0.4000 mg | Freq: Once | INTRAVENOUS | Status: AC
  Administered 2018-04-03: 0.4 mg via INTRAVENOUS

## 2018-04-03 MED FILL — LISINOPRIL 10 MG TABS: 10 | 30 days supply | Qty: 30 | Fill #0

## 2018-04-03 NOTE — Assessment & Plan Note (Signed)
He says he has cut back from 5 40 OZ beer a day to 2

## 2018-04-03 NOTE — Progress Notes (Signed)
04/03/2018 Donald Salas   1961/03/21  [id number]  Primary Physician [name], PA-C Primary Cardiologist: Dr Antoine Poche  HPI:  57 y.o.AA male, seen by Dr Antoine Poche 03/14/18 for evaluation of an abnormal echo.  He had recently saw Dr Levert Feinstein for evaluation of gait disturbance. 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  An echocardiogram was ordered and showed an EF of 25 - 30% with global hypokinesis.   He has no past cardiac history.  He has had progressive weakness in his legs and muscle loss particularly in his left arm.  He has had problems with gait.  He denies any chest pain or unusual dyspnea.   He drinks 4 to 5 -40 oz beers a day.  Dr Antoine Poche placed him on Toprol 25 mg daily and ordered a Myoview to r/o ischemic cardiomyopathy. He had that test today and is in the office to see me now for follow up. Unfortunately I don't have the results yet.  Symptomatically the patient remains remarkable asymptomatic. He describes himself as a "saltaholic". He has managed to cut his drinking down to 2 40 oz beers a day.    Current Outpatient Medications  Medication Sig Dispense Refill  . metoprolol succinate (TOPROL XL) 25 MG 24 hr tablet Take 1 tablet (25 mg total) by mouth daily. 90 tablet 3  . lisinopril (PRINIVIL,ZESTRIL) 10 MG tablet Take 1 tablet (10 mg total) by mouth daily. 90 tablet 3   No current facility-administered medications for this visit.     No Known Allergies  Past Medical History:  Diagnosis Date  . Alcohol addiction (HCC)   . Closed left clavicular fracture when in middle school  . Drug addiction (HCC)   . Weakness     Social History   Socioeconomic History  . Marital status: Single    Spouse name: Not on file  . Number of children: 5  . Years of education: 57  . Highest education level: High school graduate  Occupational History  . Occupation: Unemployed  Social Needs  . Financial resource strain: Not on file  . Food insecurity:   Worry: Not on file    Inability: Not on file  . Transportation needs:    Medical: Not on file    Non-medical: Not on file  Tobacco Use  . Smoking status: Current Every Day Smoker    Packs/day: 1.00    Years: 79.00    Pack years: 79.00    Types: Cigarettes    Start date: 09/09/1969  . Smokeless tobacco: Never Used  Substance and Sexual Activity  . Alcohol use: Yes    Alcohol/week: 0.0 oz    Comment: 1-4 - 40oz malt liquor per day  . Drug use: Yes    Types: Cocaine    Comment: Crack cocaine every day--smokes, Rare MJ.  Was in prison 4-5 years ago for 4 months, but started using after released.  No treatment otherwise.    [name] Sexual activity: Never  Lifestyle  . Physical activity:    Days per week: Not on file    Minutes per session: Not on file  . Stress: Not on file  Relationships  . Social connections:    Talks on phone: Not on file    Gets together: Not on file    Attends religious service: Not on file    Active member of club or organization: Not on file    Attends meetings of clubs or organizations: Not on file  Relationship status: Not on file  . Intimate partner violence:    Fear of current or ex partner: Not on file    Emotionally abused: Not on file    Physically abused: Not on file    Forced sexual activity: Not on file  Other Topics Concern  . Not on file  Social History Narrative   [place]   Graduated from [employer].   Was in [employer].     Started drugs with different women he was with throughout life.    Lives alone in a house.  Not clear how he supports his habits.    Right-handed.   No caffeine use.     Family History  Problem Relation Age of Onset  . Breast cancer Mother   . Other Father        unknown history  . Asthma Brother   . Eczema Brother      Review of Systems: General: negative for chills, fever, night sweats or weight changes.  Cardiovascular: negative for chest pain, dyspnea on exertion, edema, orthopnea,  palpitations, paroxysmal nocturnal dyspnea or shortness of breath Dermatological: negative for rash Respiratory: negative for cough or wheezing Urologic: negative for hematuria Abdominal: negative for nausea, vomiting, diarrhea, bright red blood per rectum, melena, or hematemesis Neurologic: negative for visual changes, syncope, or dizziness All other systems reviewed and are otherwise negative except as noted above.    Blood pressure 122/71, pulse 61, height 5\' 9"  (1.753 m), weight 157 lb 9.6 oz (71.5 kg), SpO2 99 %.  General appearance: alert, cooperative, no distress and poor dentition Neck: no carotid bruit and no JVD Lungs: clear to auscultation bilaterally Heart: regular rate and rhythm Extremities: no edema Skin: Skin color, texture, turgor normal. No rashes or lesions Neurologic: Grossly normal, he has weakness in his legs and Lt arm    ASSESSMENT AND PLAN:   Cardiomyopathy (HCC) Myoview pending-   Alcohol abuse He says he has cut back from 5 40 OZ beer a day to 2  Cervical myelopathy (HCC) Followed by Dr Terrace ArabiaYan- gait abnormality   PLAN  We discussed the importance of alcohol abstinence and salt avoidance. I suggested we add an ACE for now. I'll review his Myoview results with Dr Antoine PocheHochrein. 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  I doubt he is a candidate for [employer]. F/U echo after 3 months of medical Rx.   Corine ShelterLuke Soriah Leeman PA-C 04/03/2018 1:44 PM

## 2018-04-03 NOTE — Assessment & Plan Note (Signed)
Myoview pending 

## 2018-04-03 NOTE — Patient Instructions (Addendum)
Medication instructions   Start lisinopril 10 mg - one tablet daily.  Continue other medications.   Your physician recommends  a follow-up appointment after myoview stress test is reviewed- office will call you .

## 2018-04-03 NOTE — Assessment & Plan Note (Signed)
Followed by Dr Terrace ArabiaYan- gait abnormality

## 2018-04-22 ENCOUNTER — Ambulatory Visit: Payer: No Typology Code available for payment source

## 2018-04-25 ENCOUNTER — Telehealth: Payer: Self-pay | Admitting: Cardiology

## 2018-04-25 NOTE — Telephone Encounter (Signed)
New Message:    Pt would like his Stress Test results from 04-03-18 please.

## 2018-04-25 NOTE — Telephone Encounter (Signed)
Pt's aunt Donald Salas updated with stress test results along with Dr. Jenene SlickerHochrein's recommendation. Verbalized understanding.

## 2018-04-30 ENCOUNTER — Ambulatory Visit: Payer: No Typology Code available for payment source

## 2018-05-13 ENCOUNTER — Ambulatory Visit: Payer: No Typology Code available for payment source

## 2019-01-27 NOTE — Telephone Encounter (Signed)
See phone note

## 2020-06-09 ENCOUNTER — Ambulatory Visit: Payer: Self-pay | Attending: Nurse Practitioner | Admitting: Nurse Practitioner

## 2020-06-09 ENCOUNTER — Encounter: Payer: Self-pay | Admitting: Nurse Practitioner

## 2020-06-09 VITALS — Ht 69.5 in | Wt 180.0 lb

## 2020-06-09 DIAGNOSIS — G959 Disease of spinal cord, unspecified: Secondary | ICD-10-CM

## 2020-06-09 DIAGNOSIS — Z125 Encounter for screening for malignant neoplasm of prostate: Secondary | ICD-10-CM

## 2020-06-09 DIAGNOSIS — Z7689 Persons encountering health services in other specified circumstances: Secondary | ICD-10-CM

## 2020-06-09 DIAGNOSIS — Z1329 Encounter for screening for other suspected endocrine disorder: Secondary | ICD-10-CM

## 2020-06-09 DIAGNOSIS — Z1211 Encounter for screening for malignant neoplasm of colon: Secondary | ICD-10-CM

## 2020-06-09 DIAGNOSIS — I426 Alcoholic cardiomyopathy: Secondary | ICD-10-CM

## 2020-06-09 DIAGNOSIS — Z13228 Encounter for screening for other metabolic disorders: Secondary | ICD-10-CM

## 2020-06-09 DIAGNOSIS — Z130 Encounter for screening for diseases of the blood and blood-forming organs and certain disorders involving the immune mechanism: Secondary | ICD-10-CM

## 2020-06-09 DIAGNOSIS — Z13220 Encounter for screening for lipoid disorders: Secondary | ICD-10-CM

## 2020-06-09 DIAGNOSIS — Z131 Encounter for screening for diabetes mellitus: Secondary | ICD-10-CM

## 2020-06-09 MED ORDER — LISINOPRIL 10 MG PO TABS: 10.0000 mg | ORAL_TABLET | Freq: Every day | ORAL | 0 refills | Status: DC

## 2020-06-09 MED ORDER — METOPROLOL SUCCINATE ER 25 MG PO TB24: 25.0000 mg | ORAL_TABLET | Freq: Every day | ORAL | 0 refills | Status: DC

## 2020-06-09 MED FILL — LISINOPRIL 10 MG TABS: 10 | 30 days supply | Qty: 30 | Fill #0

## 2020-06-09 MED FILL — METOPROLOL SUCCINATE ER 25: 25 | 30 days supply | Qty: 30 | Fill #0

## 2020-06-09 NOTE — Progress Notes (Signed)
Virtual Visit via Telephone Note Due to national recommendations of social distancing due to [place] 19, telehealth visit is felt to be most appropriate for this patient at this time.  I discussed the limitations, risks, security and privacy concerns of performing an evaluation and management service by telephone and the availability of in person appointments. I also discussed with the patient that there may be a patient responsible charge related to this service. The patient expressed understanding and agreed to proceed.    I connected with Donald Salas on 06/09/20  at   9:50 AM EDT  EDT by telephone and verified that I am speaking with the correct person using two identifiers.   Consent I discussed the limitations, risks, security and privacy concerns of performing an evaluation and management service by telephone and the availability of in person appointments. I also discussed with the patient that there may be a patient responsible charge related to this service. The patient expressed understanding and agreed to proceed.   Location of Patient: Private Residence   Location of Provider: [place] participating in Telemedicine visit: Geryl Rankins FNP-BC [place]    History of Present Illness: Telemedicine visit for: Establish Care He has not been under the care of a PCP or Cardiology in several years. 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 He continues to smoke about half a pack of cigarettes per day. Started smoking at the age of 25. He will need updated ECHO and referral to cardiology. Patient has been advised to apply for financial assistance and schedule to see our financial counselor. Denies chest pain, shortness of  breath, palpitations, lightheadedness, dizziness, headaches or BLE edema.   BP Readings from Last 3 Encounters:  04/03/18 122/71  03/14/18 115/82  02/13/18 (!) 120/91    Cervical Myelopathy Symptoms in the past include gait abnormality, bilateral arm paresthesia and weakness, neck pain, radiating pain to bilateral shoulder, upper extremity, more on the left side, mainly involving left middle 3 fingers. He has been evaluated by neurology a few years ago and he was referred to neurosurgery based on results of MRI and worsening symptoms. MRI of spine at that time showed prominent spondylitic changes seen throughout resulting in moderate canal stenosis from C3-4 to C6-7 with severe bilateral foraminal narrowing, evidence of cord signal changes. He was lost to follow up.   Past Medical History:  Diagnosis Date   Alcohol addiction ([place])    Alcoholic cardiomyopathy ([place])    Cervical spondylosis with myelopathy    Closed left clavicular fracture when in middle school   Drug addiction ([place])    Weakness     Past Surgical History:  Procedure Laterality Date   LEG SURGERY     left chainsaw accident    Family History  Problem Relation Age of Onset   Breast cancer Mother    Other Father        unknown history   Asthma Brother    Eczema Brother     Social History   Socioeconomic History   Marital status: Single    Spouse name: Not on file   Number of children: 5   Years of education: 12   Highest education level: High school graduate  Occupational History   Occupation: Unemployed  Tobacco Use   Smoking status: Current Every Day Smoker    Packs/day: 1.00    Years: 79.00    Pack years: 79.00    Types: Cigarettes    Start date: 09/09/1969   Smokeless tobacco: Never Used  Vaping Use   Vaping Use: Never used  Substance and Sexual Activity   Alcohol use: Yes    Alcohol/week: 0.0 standard drinks    Comment: 1-4 - 40oz malt liquor per day   Drug use: Yes     Types: Cocaine    Comment: Crack cocaine every day--smokes, Rare MJ.  Was in prison 4-5 years ago for 4 months, but started using after released.  No treatment otherwise.     Sexual activity: Never  Other Topics Concern   Not on file  Social History Narrative   Born and raised in [place].   Graduated from [employer].   Was in [employer].     Started drugs with different women he was with throughout life.    Lives alone in a house.  Not clear how he supports his habits.    Right-handed.   No caffeine use.   Social Determinants of Health   Financial Resource Strain:    Difficulty of Paying Living Expenses: Not on file  Food Insecurity:    Worried About Charity fundraiser in the Last Year: Not on file   [employer] of Food in the Last Year: Not on file  Transportation Needs:    Lack of Transportation (Medical): Not on file   Lack of Transportation (Non-Medical): Not on file  Physical Activity:    Days of Exercise per Week: Not on file   Minutes of Exercise per Session: Not on file  Stress:    Feeling of Stress : Not on file  Social Connections:    Frequency of Communication with Friends and Family: Not on file   Frequency of Social Gatherings with Friends and Family: Not on file   Attends Religious Services: Not on file   Active Member of Clubs or Organizations: Not on file   Attends Archivist Meetings: Not on file   Marital Status: Not on file     Observations/Objective: Awake, alert and oriented x 3   Review of Systems  Constitutional: Negative for fever, malaise/fatigue and weight loss.  HENT: Negative.  Negative for nosebleeds.   Eyes: Negative.  Negative for blurred vision, double vision and photophobia.  Respiratory: Negative.  Negative for cough and shortness of breath.   Cardiovascular: Negative.  Negative for chest pain, palpitations and leg swelling.  Gastrointestinal: Negative.  Negative for heartburn, nausea and vomiting.   Musculoskeletal: Positive for joint pain (right knee pain). Negative for myalgias.  Neurological: Positive for sensory change and weakness. Negative for dizziness, focal weakness, seizures and headaches.  Psychiatric/Behavioral: Negative.  Negative for suicidal ideas.    Assessment and Plan: Donald Salas was seen today for establish care.  Diagnoses and all orders for this visit:  Encounter to establish care  Cardiomyopathy, alcoholic ([place]) -     lisinopril (ZESTRIL) 10 MG tablet; Take 1 tablet (10 mg total) by mouth daily. -     metoprolol succinate (TOPROL XL) 25 MG 24 hr tablet; Take 1 tablet (25 mg total) by mouth daily.  Lipid screening -     Lipid panel; Future -     Lipid panel  Screening for deficiency anemia -     CBC; Future -     CBC  Thyroid  disorder screening -     CMP14+EGFR; Future -     TSH; Future -     TSH -     CMP14+EGFR  Screening for metabolic disorder  Prostate cancer screening -     PSA; Future -     PSA  Colon cancer screening -     Fecal occult blood, imunochemical; Future  Encounter for screening for diabetes mellitus -     Hemoglobin A1c; Future -     Hemoglobin A1c  Cervical myelopathy [facility]) Needs neurosurgery referral   Patient has been advised to apply for financial assistance and schedule to see our financial counselor.   Follow Up Instructions Return in about 6 weeks (around 07/21/2020) for BP recheck.     I discussed the assessment and treatment plan with the patient. The patient was provided an opportunity to ask questions and all were answered. The patient agreed with the plan and demonstrated an understanding of the instructions.   The patient was advised to call back or seek an in-person evaluation if the symptoms worsen or if the condition fails to improve as anticipated.  I provided 20 minutes of non-face-to-face time during this encounter including median intraservice time, reviewing previous notes, labs, imaging, medications  and explaining diagnosis and management.  [name], FNP-BC

## 2020-06-10 LAB — CMP14+EGFR
ALT: 20 IU/L (ref 0–44)
AST: 22 IU/L (ref 0–40)
Albumin/Globulin Ratio: 1.7 (ref 1.2–2.2)
Albumin: 4.6 g/dL (ref 3.8–4.9)
Alkaline Phosphatase: 90 IU/L (ref 44–121)
BUN/Creatinine Ratio: 15 (ref 10–24)
BUN: 12 mg/dL (ref 8–27)
Bilirubin Total: 0.7 mg/dL (ref 0.0–1.2)
CO2: 24 mmol/L (ref 20–29)
Calcium: 9.9 mg/dL (ref 8.6–10.2)
Chloride: 101 mmol/L (ref 96–106)
Creatinine, Ser: 0.8 mg/dL (ref 0.76–1.27)
GFR calc Af Amer: 112 mL/min/{1.73_m2} (ref >59)
GFR calc non Af Amer: 97 mL/min/{1.73_m2} (ref >59)
Globulin, Total: 2.7 g/dL (ref 1.5–4.5)
Glucose: 119 mg/dL — ABNORMAL HIGH (ref 65–99)
Potassium: 4.6 mmol/L (ref 3.5–5.2)
Sodium: 139 mmol/L (ref 134–144)
Total Protein: 7.3 g/dL (ref 6.0–8.5)

## 2020-06-10 LAB — LIPID PANEL
Chol/HDL Ratio: 3.5 ratio (ref 0.0–5.0)
Cholesterol, Total: 183 mg/dL (ref 100–199)
HDL: 52 mg/dL (ref >39)
LDL Chol Calc (NIH): 111 mg/dL — ABNORMAL HIGH (ref 0–99)
Triglycerides: 111 mg/dL (ref 0–149)
VLDL Cholesterol Cal: 20 mg/dL (ref 5–40)

## 2020-06-10 LAB — CBC
Hematocrit: 46.6 % (ref 37.5–51.0)
Hemoglobin: 14.8 g/dL (ref 13.0–17.7)
MCH: 29.3 pg (ref 26.6–33.0)
MCHC: 31.8 g/dL (ref 31.5–35.7)
MCV: 92 fL (ref 79–97)
Platelets: 170 10*3/uL (ref 150–450)
RBC: 5.05 x10E6/uL (ref 4.14–5.80)
RDW: 13 % (ref 11.6–15.4)
WBC: 7.7 10*3/uL (ref 3.4–10.8)

## 2020-06-10 LAB — TSH: TSH: 1.3 u[IU]/mL (ref 0.450–4.500)

## 2020-06-10 LAB — HEMOGLOBIN A1C
Est. average glucose Bld gHb Est-mCnc: 123 mg/dL
Hgb A1c MFr Bld: 5.9 % — ABNORMAL HIGH (ref 4.8–5.6)

## 2020-06-10 LAB — PSA: Prostate Specific Ag, Serum: 0.4 ng/mL (ref 0.0–4.0)

## 2020-06-13 ENCOUNTER — Encounter: Payer: Self-pay | Admitting: Nurse Practitioner

## 2020-06-15 ENCOUNTER — Encounter: Payer: Self-pay | Admitting: *Deleted

## 2020-06-15 NOTE — Addendum Note (Signed)
Addended byMemory Dance on: 06/15/2020 01:49 PM   Modules accepted: Orders

## 2020-06-17 LAB — FECAL OCCULT BLOOD, IMMUNOCHEMICAL: Fecal Occult Bld: NEGATIVE

## 2020-07-14 ENCOUNTER — Other Ambulatory Visit: Payer: Self-pay

## 2020-07-14 ENCOUNTER — Other Ambulatory Visit: Payer: Self-pay | Admitting: Nurse Practitioner

## 2020-07-14 ENCOUNTER — Ambulatory Visit: Payer: 59 | Attending: Nurse Practitioner | Admitting: Nurse Practitioner

## 2020-07-14 ENCOUNTER — Encounter: Payer: Self-pay | Admitting: Nurse Practitioner

## 2020-07-14 VITALS — BP 121/73 | HR 84 | Temp 97.7°F | Ht 69.69 in | Wt 183.0 lb

## 2020-07-14 DIAGNOSIS — Z23 Encounter for immunization: Secondary | ICD-10-CM | POA: Diagnosis not present

## 2020-07-14 DIAGNOSIS — I426 Alcoholic cardiomyopathy: Secondary | ICD-10-CM | POA: Diagnosis not present

## 2020-07-14 DIAGNOSIS — R7303 Prediabetes: Secondary | ICD-10-CM | POA: Diagnosis not present

## 2020-07-14 DIAGNOSIS — G959 Disease of spinal cord, unspecified: Secondary | ICD-10-CM

## 2020-07-14 LAB — GLUCOSE, POCT (MANUAL RESULT ENTRY): POC Glucose: 110 mg/dl — AB (ref 70–99)

## 2020-07-14 MED ORDER — GABAPENTIN 600 MG PO TABS: 300.0000 mg | ORAL_TABLET | Freq: Every day | ORAL | 1 refills | Status: DC

## 2020-07-14 MED FILL — GABAPENTIN 600 MG TABLET: 600 | 30 days supply | Qty: 15 | Fill #0

## 2020-07-14 NOTE — Progress Notes (Signed)
Assessment & Plan:  Donald Salas was seen today for follow-up.  Diagnoses and all orders for this visit:  Prediabetes -     Glucose (CBG)  Alcoholic cardiomyopathy (HCC) -     Ambulatory referral to Cardiology  Need for immunization against influenza -     Flu Vaccine QUAD 6+ mos PF IM (Fluarix Quad PF)    Patient has been counseled on age-appropriate routine health concerns for screening and prevention. These are reviewed and up-to-date. Referrals have been placed accordingly. Immunizations are up-to-date or declined.    Subjective:   Chief Complaint  Patient presents with  . Follow-up    Pt. stated he sometime get pain like needle on his left leg and fire like sensation.    HPI Donald Salas 59 y.o. male presents to office today for follow up. He has been referred back to Cardiology for his history of CM/CHF EF 25-30%. He has been evaluated by Neurology in the past for gait instability,bilateral arm paresthesia and weakness, neck pain, radiating pain to bilateral shoulder, upper extremity, more on the left side, mainly involving left middle 3 fingers. He was referred to Neurosurgery for evaluation of abnormal MRI of cervical spine in 2019. He continues to drink alcohol (two 40s a week). He still uses crack cocaine and every other day. .   Essential Hypertension Blood pressure is well controlled. He is taking lisinopril 10 mg and toprol XL 25 mg daily. Denies chest pain, shortness of breath, palpitations, lightheadedness, dizziness, headaches or BLE edema.  BP Readings from Last 3 Encounters:  07/14/20 121/73  04/03/18 122/71  03/14/18 115/82   Review of Systems  Constitutional: Negative for fever, malaise/fatigue and weight loss.  HENT: Negative.  Negative for nosebleeds.   Eyes: Negative.  Negative for blurred vision, double vision and photophobia.  Respiratory: Negative.  Negative for cough and shortness of breath.   Cardiovascular: Negative.  Negative for chest pain,  palpitations and leg swelling.  Gastrointestinal: Negative.  Negative for heartburn, nausea and vomiting.  Musculoskeletal: Positive for neck pain. Negative for myalgias.  Neurological: Positive for sensory change and weakness (left sided upper extremity). Negative for dizziness, focal weakness, seizures and headaches.  Psychiatric/Behavioral: Negative.  Negative for suicidal ideas.    Past Medical History:  Diagnosis Date  . Alcohol addiction (HCC)   . Alcoholic cardiomyopathy (HCC)   . Cervical spondylosis with myelopathy   . Closed left clavicular fracture when in middle school  . Drug addiction (HCC)   . Weakness     Past Surgical History:  Procedure Laterality Date  . LEG SURGERY     left chainsaw accident    Family History  Problem Relation Age of Onset  . Breast cancer Mother   . Other Father        unknown history  . Asthma Brother   . Eczema Brother     Social History Reviewed with no changes to be made today.   Outpatient Medications Prior to Visit  Medication Sig Dispense Refill  . lisinopril (ZESTRIL) 10 MG tablet Take 1 tablet (10 mg total) by mouth daily. 90 tablet 0  . metoprolol succinate (TOPROL XL) 25 MG 24 hr tablet Take 1 tablet (25 mg total) by mouth daily. 90 tablet 0   No facility-administered medications prior to visit.    No Known Allergies     Objective:    BP 121/73 (BP Location: Right Arm, Patient Position: Sitting, Cuff Size: Normal)   Pulse 84  Temp 97.7 F (36.5 C) (Temporal)   Ht 5' 9.69" (1.77 m)   Wt 183 lb (83 kg)   SpO2 96%   BMI 26.50 kg/m  Wt Readings from Last 3 Encounters:  07/14/20 183 lb (83 kg)  06/09/20 180 lb (81.6 kg)  04/03/18 157 lb (71.2 kg)    Physical Exam Vitals and nursing note reviewed.  Constitutional:      Appearance: He is well-developed.  HENT:     Head: Normocephalic and atraumatic.  Cardiovascular:     Rate and Rhythm: Normal rate and regular rhythm.     Heart sounds: Normal heart  sounds. No murmur heard.  No friction rub. No gallop.   Pulmonary:     Effort: Pulmonary effort is normal. No tachypnea or respiratory distress.     Breath sounds: Normal breath sounds. No decreased breath sounds, wheezing, rhonchi or rales.  Chest:     Chest wall: No tenderness.  Abdominal:     General: Bowel sounds are normal.     Palpations: Abdomen is soft.  Musculoskeletal:        General: Normal range of motion.     Cervical back: Normal range of motion.  Skin:    General: Skin is warm and dry.  Neurological:     Mental Status: He is alert and oriented to person, place, and time.     Motor: Weakness (left upper extremity) present.     Coordination: Coordination abnormal.     Gait: Gait abnormal.  Psychiatric:        Behavior: Behavior normal. Behavior is cooperative.        Thought Content: Thought content normal.        Judgment: Judgment normal.          Patient has been counseled extensively about nutrition and exercise as well as the importance of adherence with medications and regular follow-up. The patient was given clear instructions to go to ER or return to medical center if symptoms don't improve, worsen or new problems develop. The patient verbalized understanding.   Follow-up: Return in about 3 months (around 10/14/2020), or if symptoms worsen or fail to improve.   Claiborne Rigg, FNP-BC [facility] [place], [place] [phone]   07/14/2020, 1:48 PM

## 2020-07-18 NOTE — Progress Notes (Signed)
Cardiology Office Note   Date:  07/19/2020   ID:  Donald Salas, DOB [birth year], MRN [id number]  PCP:  Claiborne Rigg, NP  Cardiologist:   No primary care provider on file. Referring:       Chief Complaint  Patient presents with  . Extremity Weakness      History of Present Illness: Donald Salas is a 59 y.o. male who presents for evaluation of an abnormal echo.   I saw him in 2019 when he was found to have an EF of 25 - 30% with global hypokinesis.   He had no past cardiac history otherwise.  He did have a stress perfusion study which did not suggest ischemia.  He then seems to have been lost to follow-up although he has been compliant with his medicines.  He came today for follow-up and has had no new complaints.  He does get chronic shortness of breath with activity but this is mostly because he is limited by chronic leg weakness.  He apparently has some back problems.  He also has had upper extremity muscle wasting related to cervical spinal disease.  He has an abnormal gait.  He is very sedentary because of this.  He denies any resting shortness of breath, PND or orthopnea.  He has had no new palpitations, presyncope or syncope.  He denies any chest pressure, neck or arm discomfort.  He has had no weight gain or edema.      Past Medical History:  Diagnosis Date  . Alcohol addiction (HCC)   . Alcoholic cardiomyopathy (HCC)   . Cervical spondylosis with myelopathy   . Closed left clavicular fracture when in middle school  . Drug addiction (HCC)   . Weakness     Past Surgical History:  Procedure Laterality Date  . LEG SURGERY     left chainsaw accident     Current Outpatient Medications  Medication Sig Dispense Refill  . gabapentin (NEURONTIN) 600 MG tablet Take 0.5 tablets (300 mg total) by mouth at bedtime. 45 tablet 1  . lisinopril (ZESTRIL) 10 MG tablet Take 1 tablet (10 mg total) by mouth daily. 90 tablet 3  . metoprolol succinate (TOPROL XL) 50 MG 24 hr  tablet Take 1 tablet (50 mg total) by mouth daily. 90 tablet 3   No current facility-administered medications for this visit.    Allergies:   Patient has no known allergies.    ROS:  Please see the history of present illness.   Otherwise, review of systems are positive for none.   All other systems are reviewed and negative.    PHYSICAL EXAM: VS:  BP 120/80   Pulse 79   Ht 5\' 9"  (1.753 m)   Wt 182 lb (82.6 kg)   SpO2 95%   BMI 26.88 kg/m  , BMI Body mass index is 26.88 kg/m. GENERAL:  Well appearing NECK:  No jugular venous distention, waveform within normal limits, carotid upstroke brisk and symmetric, no bruits, no thyromegaly LUNGS:  Clear to auscultation bilaterally CHEST:  Unremarkable HEART:  PMI not displaced or sustained,S1 and S2 within normal limits, no S3, no S4, no clicks, no rubs, no murmurs ABD:  Flat, positive bowel sounds normal in frequency in pitch, no bruits, no rebound, no guarding, no midline pulsatile mass, no hepatomegaly, no splenomegaly EXT:  2 plus pulses upper and decreased  DP/PT biltateral, no edema, no cyanosis no clubbing   EKG:  EKG is  ordered today. The ekg  ordered today demonstrates sinus rhythm, rate 79, axis within normal limits, intervals within normal limits, left ventricular hypertrophy with repolarization changes.   Recent Labs: 06/09/2020: ALT 20; BUN 12; Creatinine, Ser 0.80; Hemoglobin 14.8; Platelets 170; Potassium 4.6; Sodium 139; TSH 1.300    Lipid Panel    Component Value Date/Time   CHOL 183 06/09/2020 1415   TRIG 111 06/09/2020 1415   HDL 52 06/09/2020 1415   CHOLHDL 3.5 06/09/2020 1415   LDLCALC 111 (H) 06/09/2020 1415      Wt Readings from Last 3 Encounters:  07/19/20 182 lb (82.6 kg)  07/14/20 183 lb (83 kg)  06/09/20 180 lb (81.6 kg)      Other studies Reviewed: Additional studies/ records that were reviewed today include: None. Review of the above records demonstrates:  Please see elsewhere in the note.      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  He is now drinking only a couple of beers a week as opposed to 5 40 ounces per day.   I am going to increase his metoprolol to 50 mg daily and reassess an echocardiogram.  He seems to be euvolemic.     Current medicines are reviewed at length with the patient today.  The patient does not have concerns regarding medicines.  The following changes have been made: As above  Labs/ tests ordered today include:    Orders Placed This Encounter  Procedures  . EKG 12-Lead  . ECHOCARDIOGRAM COMPLETE     Disposition:   FU with Corine Shelter, PAc in 3 months.   Signed, Rollene Rotunda, MD  07/19/2020 1:00 PM    [facility]

## 2020-07-19 ENCOUNTER — Other Ambulatory Visit: Payer: Self-pay

## 2020-07-19 ENCOUNTER — Encounter: Payer: Self-pay | Admitting: Cardiology

## 2020-07-19 ENCOUNTER — Ambulatory Visit (INDEPENDENT_AMBULATORY_CARE_PROVIDER_SITE_OTHER): Payer: 59 | Admitting: Cardiology

## 2020-07-19 ENCOUNTER — Other Ambulatory Visit: Payer: Self-pay | Admitting: Cardiology

## 2020-07-19 VITALS — BP 120/80 | HR 79 | Ht 69.0 in | Wt 182.0 lb

## 2020-07-19 DIAGNOSIS — I426 Alcoholic cardiomyopathy: Secondary | ICD-10-CM | POA: Diagnosis not present

## 2020-07-19 DIAGNOSIS — I420 Dilated cardiomyopathy: Secondary | ICD-10-CM | POA: Diagnosis not present

## 2020-07-19 MED ORDER — METOPROLOL SUCCINATE ER 50 MG PO TB24: 50.0000 mg | ORAL_TABLET | Freq: Every day | ORAL | 3 refills | Status: DC

## 2020-07-19 MED ORDER — LISINOPRIL 10 MG PO TABS: 10.0000 mg | ORAL_TABLET | Freq: Every day | ORAL | 3 refills | Status: DC

## 2020-07-19 MED FILL — METOPROLOL SUCCINATE ER 50: 50 | 30 days supply | Qty: 30 | Fill #0

## 2020-07-19 MED FILL — LISINOPRIL 10 MG TABS: 10 | 30 days supply | Qty: 30 | Fill #0

## 2020-07-19 NOTE — Patient Instructions (Signed)
Medication Instructions:  Increase your metoprolol to 50 mg per day. *If you need a refill on your cardiac medications before your next appointment, please call your pharmacy*   Lab Work: None ordered If you have labs (blood work) drawn today and your tests are completely normal, you will receive your results only by: [name] MyChart Message (if you have MyChart) OR . A paper copy in the mail If you have any lab test that is abnormal or we need to change your treatment, we will call you to review the results.   Testing/Procedures: Your physician has requested that you have an echocardiogram before your follow up appointment in 3 months. Echocardiography is a painless test that uses sound waves to create images of your heart. It provides your doctor with information about the size and shape of your heart and how well your heart's chambers and valves are working. This procedure takes approximately one hour. There are no restrictions for this procedure.     Follow-Up: At [facility], you and your health needs are our priority.  As part of our continuing mission to provide you with exceptional heart care, we have created designated Provider Care Teams.  These Care Teams include your primary Cardiologist (physician) and Advanced Practice Providers (APPs -  Physician Assistants and Nurse Practitioners) who all work together to provide you with the care you need, when you need it.  We recommend signing up for the patient portal called "MyChart".  Sign up information is provided on this After Visit Summary.  MyChart is used to connect with patients for Virtual Visits (Telemedicine).  Patients are able to view lab/test results, encounter notes, upcoming appointments, etc.  Non-urgent messages can be sent to your provider as well.   To learn more about what you can do with MyChart, go to [email or web address].    Your next appointment:   3 month(s)  The format for your next appointment:   In  Person  Provider:   You will see one of the following Advanced Practice Providers on your designated Care Team:    Corine Shelter, [place]    Other Instructions None

## 2020-07-26 ENCOUNTER — Other Ambulatory Visit: Payer: Self-pay | Admitting: Neurosurgery

## 2020-07-26 DIAGNOSIS — R531 Weakness: Secondary | ICD-10-CM

## 2020-07-26 DIAGNOSIS — M47812 Spondylosis without myelopathy or radiculopathy, cervical region: Secondary | ICD-10-CM

## 2020-07-26 DIAGNOSIS — M4712 Other spondylosis with myelopathy, cervical region: Secondary | ICD-10-CM

## 2020-08-10 ENCOUNTER — Ambulatory Visit (HOSPITAL_COMMUNITY): Payer: 59 | Attending: Cardiovascular Disease

## 2020-08-10 ENCOUNTER — Other Ambulatory Visit (HOSPITAL_COMMUNITY): Payer: 59

## 2020-08-10 ENCOUNTER — Encounter (INDEPENDENT_AMBULATORY_CARE_PROVIDER_SITE_OTHER): Payer: Self-pay

## 2020-08-10 ENCOUNTER — Other Ambulatory Visit: Payer: Self-pay

## 2020-08-10 DIAGNOSIS — I420 Dilated cardiomyopathy: Secondary | ICD-10-CM

## 2020-08-10 LAB — ECHOCARDIOGRAM COMPLETE
Area-P 1/2: 3.03 cm2
S' Lateral: 2.8 cm
Single Plane A2C EF: 50.7 %

## 2020-08-11 ENCOUNTER — Ambulatory Visit
Admission: RE | Admit: 2020-08-11 | Discharge: 2020-08-11 | Disposition: A | Discharge status: Home / Self Care | Payer: 59 | Source: Ambulatory Visit | Attending: Neurosurgery | Admitting: Neurosurgery

## 2020-08-11 DIAGNOSIS — M47812 Spondylosis without myelopathy or radiculopathy, cervical region: Secondary | ICD-10-CM

## 2020-08-11 DIAGNOSIS — R531 Weakness: Secondary | ICD-10-CM

## 2020-08-13 ENCOUNTER — Ambulatory Visit
Admission: RE | Admit: 2020-08-13 | Discharge: 2020-08-13 | Disposition: A | Discharge status: Home / Self Care | Payer: 59 | Source: Ambulatory Visit | Attending: Neurosurgery | Admitting: Neurosurgery

## 2020-08-13 DIAGNOSIS — M4712 Other spondylosis with myelopathy, cervical region: Secondary | ICD-10-CM

## 2020-08-18 ENCOUNTER — Telehealth: Payer: Self-pay

## 2020-08-18 DIAGNOSIS — I426 Alcoholic cardiomyopathy: Secondary | ICD-10-CM

## 2020-08-18 MED ORDER — LISINOPRIL 10 MG PO TABS: 10.0000 mg | ORAL_TABLET | Freq: Every day | ORAL | 3 refills | Status: DC

## 2020-08-18 MED ORDER — METOPROLOL SUCCINATE ER 50 MG PO TB24: 50.0000 mg | ORAL_TABLET | Freq: Every day | ORAL | 3 refills | Status: DC

## 2020-08-18 MED FILL — LISINOPRIL 10 MG TABS: 10 | 90 days supply | Qty: 90 | Fill #0

## 2020-08-18 MED FILL — METOPROLOL SUCCINATE ER 50: 50 | 90 days supply | Qty: 90 | Fill #0

## 2020-08-18 NOTE — Telephone Encounter (Signed)
Discussed echo results when patient requested refill on metoprolol and lisinopril. Refill sent to preferred pharmacy.

## 2020-08-18 NOTE — Telephone Encounter (Signed)
-----   Message from Rollene Rotunda, MD sent at 08/13/2020  7:51 AM EST ----- The EF is mildly reduced but much improved compared to previous.  He does have a mildly enlarged aortic root which I will follow in one year with a CT.  Call Mr. Mones with the results and send results to Claiborne Rigg, NP

## 2020-08-30 ENCOUNTER — Other Ambulatory Visit: Payer: Self-pay | Admitting: Neurosurgery

## 2020-09-02 ENCOUNTER — Other Ambulatory Visit: Payer: Self-pay | Admitting: Neurosurgery

## 2020-09-06 ENCOUNTER — Other Ambulatory Visit (HOSPITAL_COMMUNITY): Payer: 59

## 2020-09-06 ENCOUNTER — Inpatient Hospital Stay (HOSPITAL_COMMUNITY)
Admission: RE | Admit: 2020-09-06 | Discharge: 2020-09-06 | Disposition: A | Discharge status: Home / Self Care | Payer: 59 | Source: Ambulatory Visit

## 2020-09-06 NOTE — Progress Notes (Signed)
[facility] [place], [place] - [place] [address] [place] [address] Phone: [phone] Fax: [phone]      Your procedure is scheduled on Wednesday, December 15  Report to [facility] Main Entrance "A" at 1200 P.M., and check in at the Admitting office.  Call this number if you have problems the morning of surgery:  [phone]  Call [phone] if you have any questions prior to your surgery date Monday-Friday 8am-4pm    Remember:  Do not eat or drink after midnight the night before your surgery    Take these medicines the morning of surgery with A SIP OF WATER  metoprolol succinate (TOPROL XL)  As of today, STOP taking any Aspirin (unless otherwise instructed by your surgeon) Aleve, Naproxen, Ibuprofen, Motrin, Advil, Goody's, BC's, all herbal medications, fish oil, and all vitamins.                      Do not wear jewelry            Do not wear lotions, powders, colognes, or deodorant.            Men may shave face and neck.            Do not bring valuables to the hospital.            [facility] is not responsible for any belongings or valuables.  Do NOT Smoke (Tobacco/Vaping) or drink Alcohol 24 hours prior to your procedure If you use a CPAP at night, you may bring all equipment for your overnight stay.   Contacts, glasses, dentures or bridgework may not be worn into surgery.      For patients admitted to the hospital, discharge time will be determined by your treatment team.   Patients discharged the day of surgery will not be allowed to drive home, and someone needs to stay with them for 24 hours.    Special instructions:   [facility]- Preparing For Surgery  Before surgery, you can play an important role. Because skin is not sterile, your skin needs to be as free of germs as possible. You can reduce the number of germs on your skin by washing with CHG (chlorahexidine gluconate) Soap before surgery.  CHG is an  antiseptic cleaner which kills germs and bonds with the skin to continue killing germs even after washing.    Oral Hygiene is also important to reduce your risk of infection.  Remember - BRUSH YOUR TEETH THE MORNING OF SURGERY WITH YOUR REGULAR TOOTHPASTE  Please do not use if you have an allergy to CHG or antibacterial soaps. If your skin becomes reddened/irritated stop using the CHG.  Do not shave (including legs and underarms) for at least 48 hours prior to first CHG shower. It is OK to shave your face.  Please follow these instructions carefully.   1. Shower the NIGHT BEFORE SURGERY and the MORNING OF SURGERY with CHG Soap.   2. If you chose to wash your hair, wash your hair first as usual with your normal shampoo.  3. After you shampoo, rinse your hair and body thoroughly to remove the shampoo.  4. Use CHG as you would any other liquid soap. You can apply CHG directly to the skin and wash gently with a scrungie or a clean washcloth.   5. Apply the CHG Soap to your body ONLY FROM THE NECK DOWN.  Do not use on open wounds  or open sores. Avoid contact with your eyes, ears, mouth and genitals (private parts). Wash Face and genitals (private parts)  with your normal soap.   6. Wash thoroughly, paying special attention to the area where your surgery will be performed.  7. Thoroughly rinse your body with warm water from the neck down.  8. DO NOT shower/wash with your normal soap after using and rinsing off the CHG Soap.  9. Pat yourself dry with a CLEAN TOWEL.  10. Wear CLEAN PAJAMAS to bed the night before surgery  11. Place CLEAN SHEETS on your bed the night of your first shower and DO NOT SLEEP WITH PETS.   Day of Surgery: Wear Clean/Comfortable clothing the morning of surgery Do not apply any deodorants/lotions.   Remember to brush your teeth WITH YOUR REGULAR TOOTHPASTE.   Please read over the following fact sheets that you were given.

## 2020-09-07 ENCOUNTER — Encounter (HOSPITAL_COMMUNITY): Payer: Self-pay | Admitting: Physician Assistant

## 2020-09-07 NOTE — Progress Notes (Addendum)
Anesthesia Chart Review: Pt missed preadmission testing appt and is now a SDW.  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 Initially seen by Dr. Antoine Poche 02/24/2018 and nuclear stress was ordered. He commented on result stating, "Severely reduced EF. Not likely related to CAD. Likely related to EtOH. Continue medical management. No cath is indicated." At that time the pt was drinking five 40oz beers per day. Pt was last seen by Dr. Antoine Poche 07/19/20, had been lost to followup for some time but was reportedly compliant with medications. He was noted to have significantly reduced etoh intake to only a couple beers per week. Not also mentions pt has upper extremity muscle wasting related to cervical spine disease. His metoprolol was increased to 50 mg daily and updated echo was ordered which showed some recovery of LV function. Dr. Antoine Poche commented on result stating, "The EF is mildly reduced but much improved compared to previous. He does have a mildly enlarged aortic root which I will follow in one year with a CT."   Will need DOS labs and eval.  EKG 07/19/20 (interpretation per Dr. Jenene Slicker note): The ekg ordered today demonstrates sinus rhythm, rate 79, axis within normal limits, intervals within normal limits, left ventricular hypertrophy with repolarization changes.   MRI C-Spine 08/13/20: IMPRESSION: 1. Chronic diffuse cervical spinal stenosis due to combined congenital and acquired degenerative disease. 2. Widespread chronic spinal cord mass effect and multifocal Spinal Cord Myelomalacia, most pronounced at C4-C5. 3. Associated diffuse moderate to severe cervical foraminal stenosis. 4. Acute degenerative marrow edema at the left C2-C3 facets, C3-C4 vertebral bodies. Resolved similar edema seen in 2019 at the C5-C6 endplates.  TTE 08/10/20: 1. Left ventricular ejection fraction, by estimation, is 45 to 50%. The  left ventricle has mildly decreased function. The  left ventricle  demonstrates global hypokinesis. There is mild concentric left ventricular  hypertrophy. Left ventricular diastolic  parameters are consistent with Grade I diastolic dysfunction (impaired  relaxation).  2. Right ventricular systolic function is normal. The right ventricular  size is normal. Tricuspid regurgitation signal is inadequate for assessing  PA pressure.  3. The mitral valve is normal in structure. No evidence of mitral valve  regurgitation. No evidence of mitral stenosis.  4. The aortic valve is normal in structure. Aortic valve regurgitation is  not visualized. No aortic stenosis is present.  5. Aortic dilatation noted. There is mild dilatation of the ascending  aorta, measuring 42 mm.  6. The inferior vena cava is normal in size with greater than 50%  respiratory variability, suggesting right atrial pressure of 3 mmHg.   Comparison(s): Prior images unable to be directly viewed, comparison made  by report only. The left ventricular function has improved.   Nuclear stress 04/03/18:  The left ventricular ejection fraction is severely decreased (<30%).  Nuclear stress EF: calculated at 17% but appears 25-30% with diffuse hypokinesis.  There is a small defect of moderate severity present in the basal inferior and mid inferior location. The defect is non-reversible. This represents infarct vs. Diaphragmatic attenuation artifact. No ischemia noted.  Baseline EKG with LVH and repolarization abnormality. No change from baseline EKG with Lexiscan infusion  This is a high risk study due to LV dysfunction.   Zannie Cove [facility] Short Stay Center/Anesthesiology Phone [phone] 09/07/2020 10:28 AM

## 2020-09-07 NOTE — Anesthesia Preprocedure Evaluation (Deleted)
Anesthesia Evaluation    Airway        Dental   Pulmonary Current Smoker,           Cardiovascular      Neuro/Psych    GI/Hepatic   Endo/Other    Renal/GU      Musculoskeletal   Abdominal   Peds  Hematology   Anesthesia Other Findings   Reproductive/Obstetrics                             Anesthesia Physical Anesthesia Plan  ASA:   Anesthesia Plan:    Post-op Pain Management:    Induction:   PONV Risk Score and Plan:   Airway Management Planned:   Additional Equipment:   Intra-op Plan:   Post-operative Plan:   Informed Consent:   Plan Discussed with:   Anesthesia Plan Comments: (PAT note by Antionette Poles, PA-C: 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 Initially seen by Dr. Antoine Poche 02/24/2018 and nuclear stress was ordered. He commented on result stating, "Severely reduced EF. Not likely related to CAD. Likely related to EtOH. Continue medical management. No cath is indicated." At that time the pt was drinking five 40oz beers per day. Pt was last seen by Dr. Antoine Poche 07/19/20, had been lost to followup for some time but was reportedly compliant with medications. He was noted to have significantly reduced etoh intake to only a couple beers per week. Not also mentions pt has upper extremity muscle wasting related to cervical spine disease. His metoprolol was increased to 50 mg daily and updated echo was ordered which showed some recovery of LV function. Dr. Antoine Poche commented on result stating, "The EF is mildly reduced but much improved compared to previous. He does have a mildly enlarged aortic root which I will follow in one year with a CT."   Will need DOS labs and eval.  EKG 07/19/20 (interpretation per Dr. Jenene Slicker note): The ekg ordered today demonstrates sinus rhythm, rate 79, axis within normal limits, intervals within normal limits, left ventricular  hypertrophy with repolarization changes.   MRI C-Spine 08/13/20: IMPRESSION: 1. Chronic diffuse cervical spinal stenosis due to combined congenital and acquired degenerative disease. 2. Widespread chronic spinal cord mass effect and multifocal Spinal Cord Myelomalacia, most pronounced at C4-C5. 3. Associated diffuse moderate to severe cervical foraminal stenosis. 4. Acute degenerative marrow edema at the left C2-C3 facets, C3-C4 vertebral bodies. Resolved similar edema seen in 2019 at the C5-C6 endplates.  TTE 08/10/20: 1. Left ventricular ejection fraction, by estimation, is 45 to 50%. The  left ventricle has mildly decreased function. The left ventricle  demonstrates global hypokinesis. There is mild concentric left ventricular  hypertrophy. Left ventricular diastolic  parameters are consistent with Grade I diastolic dysfunction (impaired  relaxation).  2. Right ventricular systolic function is normal. The right ventricular  size is normal. Tricuspid regurgitation signal is inadequate for assessing  PA pressure.  3. The mitral valve is normal in structure. No evidence of mitral valve  regurgitation. No evidence of mitral stenosis.  4. The aortic valve is normal in structure. Aortic valve regurgitation is  not visualized. No aortic stenosis is present.  5. Aortic dilatation noted. There is mild dilatation of the ascending  aorta, measuring 42 mm.  6. The inferior vena cava is normal in size with greater than 50%  respiratory variability, suggesting right atrial pressure of 3 mmHg.   Comparison(s): Prior images unable to be  directly viewed, comparison made  by report only. The left ventricular function has improved.   Nuclear stress 04/03/18: The left ventricular ejection fraction is severely decreased (<30%). Nuclear stress EF: calculated at 17% but appears 25-30% with diffuse hypokinesis. There is a small defect of moderate severity present in the basal inferior and mid  inferior location. The defect is non-reversible. This represents infarct vs. Diaphragmatic attenuation artifact. No ischemia noted. Baseline EKG with LVH and repolarization abnormality. No change from baseline EKG with Lexiscan infusion This is a high risk study due to LV dysfunction.  )        Anesthesia Quick Evaluation

## 2020-09-07 NOTE — Progress Notes (Signed)
I called  [name] at Dr [facility] and informed her that Mr. Donald Salas did not come to his PAT appointment and he has not been tested for Covid.

## 2020-09-08 ENCOUNTER — Encounter (HOSPITAL_COMMUNITY): Admission: RE | Payer: Self-pay | Source: Home / Self Care

## 2020-09-08 ENCOUNTER — Ambulatory Visit (HOSPITAL_COMMUNITY): Admission: RE | Admit: 2020-09-08 | Payer: 59 | Source: Home / Self Care

## 2020-09-08 SURGERY — ANTERIOR CERVICAL DECOMPRESSION/DISCECTOMY FUSION 2 LEVELS
Anesthesia: General

## 2020-10-05 DIAGNOSIS — Z0271 Encounter for disability determination: Secondary | ICD-10-CM

## 2020-10-11 ENCOUNTER — Ambulatory Visit: Payer: No Typology Code available for payment source | Admitting: Nurse Practitioner

## 2020-10-16 DIAGNOSIS — I7789 Other specified disorders of arteries and arterioles: Secondary | ICD-10-CM | POA: Insufficient documentation

## 2020-10-16 NOTE — Progress Notes (Signed)
Cardiology Office Note   Date:  10/19/2020   ID:  Donald Salas, DOB 1961/06/29, MRN [id number]  PCP:  Claiborne Rigg, NP  Cardiologist:   No primary care provider on file.       Chief Complaint  Patient presents with  . Cardiomyopathy      History of Present Illness: Donald Salas is a 60 y.o. male who presents for evaluation of an abnormal echo.   I saw him in 2019 when he was found to have an EF of 25 - 30% with global hypokinesis.   He had no past cardiac history otherwise.  He did have a stress perfusion study which did not suggest ischemia.  He then seems to have been lost to follow-up.  He came back fall 2021 and had a follow up echo with an EF of 45 - 50%.      Since I last saw him he is only drinking 1 beer a week.  The patient denies any new symptoms such as chest discomfort, neck or arm discomfort. There has been no new shortness of breath, PND or orthopnea. There have been no reported palpitations, presyncope or syncope.  He moves very slowly.  He has limb girdle weakness and back problems and joint problems.  Past Medical History:  Diagnosis Date  . Alcohol addiction (HCC)   . Alcoholic cardiomyopathy (HCC)   . Cervical spondylosis with myelopathy   . Closed left clavicular fracture when in middle school  . Drug addiction (HCC)   . Weakness     Past Surgical History:  Procedure Laterality Date  . LEG SURGERY     left chainsaw accident     Current Outpatient Medications  Medication Sig Dispense Refill  . lisinopril (ZESTRIL) 10 MG tablet Take 1 tablet (10 mg total) by mouth daily. 90 tablet 3  . metoprolol succinate (TOPROL XL) 50 MG 24 hr tablet Take 1 tablet (50 mg total) by mouth daily. 90 tablet 3   No current facility-administered medications for this visit.    Allergies:   Patient has no known allergies.    ROS:  Please see the history of present illness.   Otherwise, review of systems are positive for none.   All other systems are reviewed  and negative.    PHYSICAL EXAM: VS:  BP 120/88   Pulse 96   Ht 5\' 10"  (1.778 m)   Wt 183 lb 12.8 oz (83.4 kg)   BMI 26.37 kg/m  , BMI Body mass index is 26.37 kg/m. GENERAL:  Well appearing NECK:  No jugular venous distention, waveform within normal limits, carotid upstroke brisk and symmetric, no bruits, no thyromegaly LUNGS:  Clear to auscultation bilaterally CHEST:  Unremarkable HEART:  PMI not displaced or sustained,S1 and S2 within normal limits, no S3, no S4, no clicks, no rubs, no murmurs ABD:  Flat, positive bowel sounds normal in frequency in pitch, no bruits, no rebound, no guarding, no midline pulsatile mass, no hepatomegaly, no splenomegaly EXT:  2 plus pulses upper and decreased  DP/PT biltateral, no edema, no cyanosis no clubbing   EKG:  EKG is not ordered today. The ekg ordered today demonstrates sinus rhythm, rate 79, axis within normal limits, intervals within normal limits, left ventricular hypertrophy with repolarization changes.   Recent Labs: 06/09/2020: ALT 20; BUN 12; Creatinine, Ser 0.80; Hemoglobin 14.8; Platelets 170; Potassium 4.6; Sodium 139; TSH 1.300    Lipid Panel    Component Value Date/Time  CHOL 183 06/09/2020 1415   TRIG 111 06/09/2020 1415   HDL 52 06/09/2020 1415   CHOLHDL 3.5 06/09/2020 1415   LDLCALC 111 (H) 06/09/2020 1415      Wt Readings from Last 3 Encounters:  10/19/20 183 lb 12.8 oz (83.4 kg)  07/19/20 182 lb (82.6 kg)  07/14/20 183 lb (83 kg)      Other studies Reviewed: Additional studies/ records that were reviewed today include: None . Review of the above records demonstrates:  Please see elsewhere in the note.     ASSESSMENT AND PLAN:  CARDIOMYOPATHY:      His EF was milldy reduced.  Unfortunately he is not taking his medications and we will renew these today.  At the next visit hopefully we can titrate.   AORTIC ROOT ENLARGEMENT: This was 42 mm on echo.  Although I am not scheduling this now we will need a CT  aortogram in November to follow-up on this.    Current medicines are reviewed at length with the patient today.  The patient does not have concerns regarding medicines.  The following changes have been made: None  Labs/ tests ordered today include:  None  No orders of the defined types were placed in this encounter.    Disposition:   FU with APP in 2 months.   Signed, Rollene Rotunda, MD  10/19/2020 2:20 PM    [facility]

## 2020-10-19 ENCOUNTER — Encounter: Payer: Self-pay | Admitting: Cardiology

## 2020-10-19 ENCOUNTER — Other Ambulatory Visit: Payer: Self-pay

## 2020-10-19 ENCOUNTER — Ambulatory Visit (INDEPENDENT_AMBULATORY_CARE_PROVIDER_SITE_OTHER): Payer: 59 | Admitting: Cardiology

## 2020-10-19 VITALS — BP 120/88 | HR 96 | Ht 70.0 in | Wt 183.8 lb

## 2020-10-19 DIAGNOSIS — I420 Dilated cardiomyopathy: Secondary | ICD-10-CM | POA: Diagnosis not present

## 2020-10-19 DIAGNOSIS — I7789 Other specified disorders of arteries and arterioles: Secondary | ICD-10-CM

## 2020-10-19 DIAGNOSIS — I426 Alcoholic cardiomyopathy: Secondary | ICD-10-CM | POA: Diagnosis not present

## 2020-10-19 MED ORDER — METOPROLOL SUCCINATE ER 50 MG PO TB24: 50.0000 mg | ORAL_TABLET | Freq: Every day | ORAL | 3 refills | Status: DC

## 2020-10-19 MED ORDER — LISINOPRIL 10 MG PO TABS: 10.0000 mg | ORAL_TABLET | Freq: Every day | ORAL | 3 refills | Status: DC

## 2020-10-19 NOTE — Patient Instructions (Signed)
Medication Instructions:  Take your medications as prescribed *If you need a refill on your cardiac medications before your next appointment, please call your pharmacy*  Lab Work: None ordered this visit  Testing/Procedures: None ordered this visit  Follow-Up: At [facility], you and your health needs are our priority.  As part of our continuing mission to provide you with exceptional heart care, we have created designated Provider Care Teams.  These Care Teams include your primary Cardiologist (physician) and Advanced Practice Providers (APPs -  Physician Assistants and Nurse Practitioners) who all work together to provide you with the care you need, when you need it.  Your next appointment:   2 month(s)  The format for your next appointment:   In Person  Provider:   Corine Shelter, PA-C

## 2020-11-01 DIAGNOSIS — Z0279 Encounter for issue of other medical certificate: Secondary | ICD-10-CM

## 2020-12-02 ENCOUNTER — Ambulatory Visit: Payer: 59 | Admitting: Cardiology

## 2020-12-07 NOTE — Progress Notes (Unsigned)
Cardiology Clinic Note   Patient Name: Donald Salas Date of Encounter: 12/08/2020  Primary Care Provider:  Claiborne Rigg, NP Primary Cardiologist:  Rollene Rotunda, MD  Patient Profile    Donald Salas 60 year old male presents the clinic for follow-up evaluation of his dilated/alcoholic cardiomyopathy and aortic root enlargement.  Past Medical History    Past Medical History:  Diagnosis Date  . Alcohol addiction (HCC)   . Alcoholic cardiomyopathy (HCC)   . Cervical spondylosis with myelopathy   . Closed left clavicular fracture when in middle school  . Drug addiction (HCC)   . Weakness    Past Surgical History:  Procedure Laterality Date  . LEG SURGERY     left chainsaw accident    Allergies  No Known Allergies  History of Present Illness    Donald Salas has a PMH of dilated cardiomyopathy with an EF of 25-30% showing global hypokinesis.  He has no other past cardiac history.  His nuclear stress test showed no ischemia.  He was lost to follow-up.  He was seen back in the clinic fall 2021 and had a follow-up echocardiogram which showed an EF of 45-50%.  He was seen by Dr. Antoine Poche 10/19/2020.  He reported he had been drinking 1 beer per week.  He denied chest pain, neck discomfort, and arm discomfort.  He denied new shortness of breath, PND and orthopnea.  He denied palpitations, presyncope, and syncope.  He was noted to have 1 girdle weakness, back problems, and joint problems.  He presents the clinic today for follow-up evaluation states he has pain in his back and shoulders.  He also notices pain in his right leg.  These are related to orthopedic issues that he has had for quite some time.  He reports that he has been out of his lisinopril and his metoprolol for around 3 days.  His heart rate is slightly elevated today at 107 and 98 on recheck.  He reports he is limited in his physical activity due to his back shoulder and leg pain.  His blood pressure is well  controlled today at 120/88.  He reports that he eats a heart healthy low-sodium diet and does not add salt to his food.  I have asked him to increase his physical activity as tolerated, given him the heart healthy low-sodium diet paperwork, and will have him follow-up in 6 months.  Today he denies chest pain, shortness of breath, lower extremity edema, fatigue, palpitations, melena, hematuria, hemoptysis, diaphoresis, weakness, presyncope, syncope, orthopnea, and PND.   Home Medications    Prior to Admission medications   Medication Sig Start Date End Date Taking? Authorizing Provider  lisinopril (ZESTRIL) 10 MG tablet Take 1 tablet (10 mg total) by mouth daily. 10/19/20 01/17/21  Rollene Rotunda, MD  metoprolol succinate (TOPROL XL) 50 MG 24 hr tablet Take 1 tablet (50 mg total) by mouth daily. 10/19/20   Rollene Rotunda, MD    Family History    Family History  Problem Relation Age of Onset  . Breast cancer Mother   . Other Father        unknown history  . Asthma Brother   . Eczema Brother    He indicated that his mother is deceased. He indicated that his father is deceased. He indicated that his brother is alive.  Social History    Social History   Socioeconomic History  . Marital status: Single    Spouse name: Not on file  . Number  of children: 5  . Years of education: 59  . Highest education level: High school graduate  Occupational History  . Occupation: Unemployed  Tobacco Use  . Smoking status: Current Every Day Smoker    Packs/day: 1.00    Years: 79.00    Pack years: 79.00    Types: Cigarettes    Start date: 09/09/1969  . Smokeless tobacco: Never Used  Vaping Use  . Vaping Use: Never used  Substance and Sexual Activity  . Alcohol use: Yes    Alcohol/week: 0.0 standard drinks    Comment: 1-4 - 40oz malt liquor per day  . Drug use: Yes    Types: Cocaine    Comment: Crack cocaine every day--smokes, Rare MJ.  Was in prison 4-5 years ago for 4 months, but started  using after released.  No treatment otherwise.    [name] Sexual activity: Never  Other Topics Concern  . Not on file  Social History Narrative   Born and raised in [place].   Graduated from [employer].   Was in [employer]	[place].     Started drugs with different women he was with throughout life.    Lives alone in a house.  Not clear how he supports his habits.    Right-handed.   No caffeine use.   Social Determinants of Health   Financial Resource Strain: Not on file  Food Insecurity: Not on file  Transportation Needs: Not on file  Physical Activity: Not on file  Stress: Not on file  Social Connections: Not on file  Intimate Partner Violence: Not on file     Review of Systems    General:  No chills, fever, night sweats or weight changes.  Cardiovascular:  No chest pain, dyspnea on exertion, edema, orthopnea, palpitations, paroxysmal nocturnal dyspnea. Dermatological: No rash, lesions/masses Respiratory: No cough, dyspnea Urologic: No hematuria, dysuria Abdominal:   No nausea, vomiting, diarrhea, bright red blood per rectum, melena, or hematemesis Neurologic:  No visual changes, wkns, changes in mental status. All other systems reviewed and are otherwise negative except as noted above.  Physical Exam    VS:  BP 120/88   Pulse 98   Ht 5' 9.5" (1.765 m)   Wt 182 lb (82.6 kg)   SpO2 97%   BMI 26.49 kg/m  , BMI Body mass index is 26.49 kg/m. GEN: Well nourished, well developed, in no acute distress. HEENT: normal. Neck: Supple, no JVD, carotid bruits, or masses. Cardiac: RRR, no murmurs, rubs, or gallops. No clubbing, cyanosis, edema.  Radials/DP/PT 2+ and equal bilaterally.  Respiratory:  Respirations regular and unlabored, clear to auscultation bilaterally. GI: Soft, nontender, nondistended, BS + x 4. MS: no deformity or atrophy. Skin: warm and dry, no rash. Neuro:  Strength and sensation are intact. Psych: Normal affect.  Accessory Clinical Findings    Recent  Labs: 06/09/2020: ALT 20; BUN 12; Creatinine, Ser 0.80; Hemoglobin 14.8; Platelets 170; Potassium 4.6; Sodium 139; TSH 1.300   Recent Lipid Panel    Component Value Date/Time   CHOL 183 06/09/2020 1415   TRIG 111 06/09/2020 1415   HDL 52 06/09/2020 1415   CHOLHDL 3.5 06/09/2020 1415   LDLCALC 111 (H) 06/09/2020 1415    ECG personally reviewed by me today-none today.  Echocardiogram 08/10/2020 IMPRESSIONS    1. Left ventricular ejection fraction, by estimation, is 45 to 50%. The  left ventricle has mildly decreased function. The left ventricle  demonstrates global hypokinesis. There is mild concentric left ventricular  hypertrophy. Left  ventricular diastolic  parameters are consistent with Grade I diastolic dysfunction (impaired  relaxation).  2. Right ventricular systolic function is normal. The right ventricular  size is normal. Tricuspid regurgitation signal is inadequate for assessing  PA pressure.  3. The mitral valve is normal in structure. No evidence of mitral valve  regurgitation. No evidence of mitral stenosis.  4. The aortic valve is normal in structure. Aortic valve regurgitation is  not visualized. No aortic stenosis is present.  5. Aortic dilatation noted. There is mild dilatation of the ascending  aorta, measuring 42 mm.  6. The inferior vena cava is normal in size with greater than 50%  respiratory variability, suggesting right atrial pressure of 3 mmHg.   Comparison(s): Prior images unable to be directly viewed, comparison made  by report only. The left ventricular function has improved.   Assessment & Plan   1.  Dilated/alcoholic cardiomyopathy-no increased DOE or activity intolerance noted today.  Echocardiogram 08/10/2020 showed improved LVEF 45-50% and G1 DD. Continue lisinopril, metoprolol Heart healthy low-sodium diet-salty 6 given Increase physical activity as tolerated  Essential hypertension-BP today 120/88.  Well-controlled at  home. Continue lisinopril, metoprolol Heart healthy low-sodium diet-salty 6 given Increase physical activity as tolerated Maintain blood pressure log  Aortic root dilation-echocardiogram showed mild dilation of the ascending aorta at 42 mm.  CTA recommended for 11/22 Continue lisinopril, metoprolol  Disposition: Follow-up with Dr. Antoine Poche or me in 6 months.   Thomasene Ripple. Cleaver NP-C    12/08/2020, 3:47 PM [facility] [address] Office [phone] Fax [phone]  Notice: This dictation was prepared with Dragon dictation along with smaller phrase technology. Any transcriptional errors that result from this process are unintentional and may not be corrected upon review.  I spent 12 minutes examining this patient, reviewing medications, and using patient centered shared decision making involving her cardiac care.  Prior to her visit I spent greater than 20 minutes reviewing her past medical history,  medications, and prior cardiac tests.

## 2020-12-08 ENCOUNTER — Encounter: Payer: Self-pay | Admitting: General Practice

## 2020-12-08 ENCOUNTER — Other Ambulatory Visit: Payer: Self-pay | Admitting: General Practice

## 2020-12-08 ENCOUNTER — Ambulatory Visit (INDEPENDENT_AMBULATORY_CARE_PROVIDER_SITE_OTHER): Payer: 59 | Admitting: General Practice

## 2020-12-08 ENCOUNTER — Other Ambulatory Visit: Payer: Self-pay

## 2020-12-08 VITALS — BP 120/88 | HR 98 | Ht 69.5 in | Wt 182.0 lb

## 2020-12-08 DIAGNOSIS — I10 Essential (primary) hypertension: Secondary | ICD-10-CM | POA: Diagnosis not present

## 2020-12-08 DIAGNOSIS — I7789 Other specified disorders of arteries and arterioles: Secondary | ICD-10-CM | POA: Diagnosis not present

## 2020-12-08 DIAGNOSIS — I426 Alcoholic cardiomyopathy: Secondary | ICD-10-CM

## 2020-12-08 DIAGNOSIS — I420 Dilated cardiomyopathy: Secondary | ICD-10-CM

## 2020-12-08 MED ORDER — LISINOPRIL 10 MG PO TABS: 10.0000 mg | ORAL_TABLET | Freq: Every day | ORAL | 3 refills | Status: DC

## 2020-12-08 MED ORDER — METOPROLOL SUCCINATE ER 50 MG PO TB24: 50.0000 mg | ORAL_TABLET | Freq: Every day | ORAL | 3 refills | Status: DC

## 2020-12-08 NOTE — Patient Instructions (Signed)
Medication Instructions:  Your physician recommends that you continue on your current medications as directed. Please refer to the Current Medication list given to you today.  *If you need a refill on your cardiac medications before your next appointment, please call your pharmacy*  Follow-Up: At [facility], you and your health needs are our priority.  As part of our continuing mission to provide you with exceptional heart care, we have created designated Provider Care Teams.  These Care Teams include your primary Cardiologist (physician) and Advanced Practice Providers (APPs -  Physician Assistants and Nurse Practitioners) who all work together to provide you with the care you need, when you need it.  Your next appointment:   6 month(s)  The format for your next appointment:   In Person  Provider:   [name], MD   Other Instructions  Heart-Healthy Eating Plan Heart-healthy meal planning includes:  Eating less unhealthy fats.  Eating more healthy fats.  Making other changes in your diet. Talk with your doctor or a diet specialist (dietitian) to create an eating plan that is right for you. What are tips for following this plan? Cooking Avoid frying your food. Try to bake, boil, grill, or broil it instead. You can also reduce fat by:  Removing the skin from poultry.  Removing all visible fats from meats.  Steaming vegetables in water or broth. Meal planning  At meals, divide your plate into four equal parts: ? Fill one-half of your plate with vegetables and green salads. ? Fill one-fourth of your plate with whole grains. ? Fill one-fourth of your plate with lean protein foods.  Eat 4-5 servings of vegetables per day. A serving of vegetables is: ? 1 cup of raw or cooked vegetables. ? 2 cups of raw leafy greens.  Eat 4-5 servings of fruit per day. A serving of fruit is: ? 1 medium whole fruit. ?  cup of dried fruit. ?  cup of fresh, frozen, or canned  fruit. ?  cup of 100% fruit juice.  Eat more foods that have soluble fiber. These are apples, broccoli, carrots, beans, peas, and barley. Try to get 20-30 g of fiber per day.  Eat 4-5 servings of nuts, legumes, and seeds per week: ? 1 serving of dried beans or legumes equals  cup after being cooked. ? 1 serving of nuts is  cup. ? 1 serving of seeds equals 1 tablespoon.   General information  Eat more home-cooked food. Eat less restaurant, buffet, and fast food.  Limit or avoid alcohol.  Limit foods that are high in starch and sugar.  Avoid fried foods.  Lose weight if you are overweight.  Keep track of how much salt (sodium) you eat. This is important if you have high blood pressure. Ask your doctor to tell you more about this.  Try to add vegetarian meals each week. Fats  Choose healthy fats. These include olive oil and canola oil, flaxseeds, walnuts, almonds, and seeds.  Eat more omega-3 fats. These include salmon, mackerel, sardines, tuna, flaxseed oil, and ground flaxseeds. Try to eat fish at least 2 times each week.  Check food labels. Avoid foods with trans fats or high amounts of saturated fat.  Limit saturated fats. ? These are often found in animal products, such as meats, butter, and cream. ? These are also found in plant foods, such as palm oil, palm kernel oil, and coconut oil.  Avoid foods with partially hydrogenated oils in them. These have trans fats. Examples  are stick margarine, some tub margarines, cookies, crackers, and other baked goods. What foods can I eat? Fruits All fresh, canned (in natural juice), or frozen fruits. Vegetables Fresh or frozen vegetables (raw, steamed, roasted, or grilled). Green salads. Grains Most grains. Choose whole wheat and whole grains most of the time. Rice and pasta, including [place] rice and pastas made with whole wheat. Meats and other proteins Lean, well-trimmed beef, veal, pork, and lamb. Chicken and [place] without  skin. All fish and shellfish. Wild duck, rabbit, pheasant, and venison. Egg whites or low-cholesterol egg substitutes. Dried beans, peas, lentils, and tofu. Seeds and most nuts. Dairy Low-fat or nonfat cheeses, including ricotta and mozzarella. Skim or 1% milk that is liquid, powdered, or evaporated. Buttermilk that is made with low-fat milk. Nonfat or low-fat yogurt. Fats and oils Non-hydrogenated (trans-free) margarines. Vegetable oils, including soybean, sesame, sunflower, olive, peanut, safflower, corn, canola, and cottonseed. Salad dressings or mayonnaise made with a vegetable oil. Beverages Mineral water. Coffee and tea. Diet carbonated beverages. Sweets and desserts Sherbet, gelatin, and fruit ice. Small amounts of dark chocolate. Limit all sweets and desserts. Seasonings and condiments All seasonings and condiments. The items listed above may not be a complete list of foods and drinks you can eat. Contact a dietitian for more options. What foods should I avoid? Fruits Canned fruit in heavy syrup. Fruit in cream or butter sauce. Fried fruit. Limit coconut. Vegetables Vegetables cooked in cheese, cream, or butter sauce. Fried vegetables. Grains Breads that are made with saturated or trans fats, oils, or whole milk. Croissants. Sweet rolls. Donuts. High-fat crackers, such as cheese crackers. Meats and other proteins Fatty meats, such as hot dogs, ribs, sausage, bacon, rib-eye roast or steak. High-fat deli meats, such as salami and bologna. Caviar. Domestic duck and goose. Organ meats, such as liver. Dairy Cream, sour cream, cream cheese, and creamed cottage cheese. Whole-milk cheeses. Whole or 2% milk that is liquid, evaporated, or condensed. Whole buttermilk. Cream sauce or high-fat cheese sauce. Yogurt that is made from whole milk. Fats and oils Meat fat, or shortening. Cocoa butter, hydrogenated oils, palm oil, coconut oil, palm kernel oil. Solid fats and shortenings, including bacon  fat, salt pork, lard, and butter. Nondairy cream substitutes. Salad dressings with cheese or sour cream. Beverages Regular sodas and juice drinks with added sugar. Sweets and desserts Frosting. Pudding. Cookies. Cakes. Pies. Milk chocolate or white chocolate. Buttered syrups. Full-fat ice cream or ice cream drinks. The items listed above may not be a complete list of foods and drinks to avoid. Contact a dietitian for more information. Summary  Heart-healthy meal planning includes eating less unhealthy fats, eating more healthy fats, and making other changes in your diet.  Eat a balanced diet. This includes fruits and vegetables, low-fat or nonfat dairy, lean protein, nuts and legumes, whole grains, and heart-healthy oils and fats. This information is not intended to replace advice given to you by your health care provider. Make sure you discuss any questions you have with your health care provider. Document Revised: 11/15/2017 Document Reviewed: 10/19/2017 Elsevier Patient Education  2021 [place].   Physical Activity With Heart Disease Being active has many benefits, especially if you have heart disease. Physical activity can help you do more and feel healthier. Start slowly, and increase the amount of time you spend being active. Most adults should aim for physical activity that:  Makes you breathe harder and raises your heart rate (aerobic activity). Try to get at least 150 minutes  of aerobic activity each week. This is about 30 minutes each day, 5 days a week.  Helps build muscle strength (strengthening activity). Do this at least 2 times a week. Always talk with your health care provider before starting any new activity program or if you have any changes in your condition. What are the benefits of physical activity? When you have heart disease, physical activity can help:  Lower your blood pressure.  Lower your cholesterol.  Control your weight.  Improve your sleep.  Help  control your blood sugar.  Improve your heart and lung function.  Reduce your risk for blood clots (thrombophlebitis).  Improve your energy level.  Reduce stress. What are some types of physical activity I could try? There are many ways to be active. Talk with your health care provider about what types and intensity of activity is right for you. Aerobic activity Aerobic (cardiovascular) activity can be moderate or vigorous intensity, depending on how hard you are working. Moderate-intensity activity includes:  Walking.  Slow bicycling.  Water aerobics.  Dancing.  Light gardening or house work. Vigorous-intensity activity includes:  Jogging or running.  Stair climbing.  Swimming laps.  Hiking uphill.  Heavy gardening, such as digging trenches.   Strengthening activity Strengthening activities work your muscles to build strength. Some examples include:  Doing push-ups, sit-ups, or pull-ups.  Lifting small weights.  Using resistance bands.   Flexibility Flexibility activities lengthen your muscles to keep them flexible and less tight and improve your balance. Some examples include:  Stretching.  Yoga.  Tai chi.  [place] barre.   Follow these instructions at home: How to get started  Talk with your health care provider about: ? What types of activities are safe for you. ? If you should check your pulse or take other precautions during physical activity.  Get a calendar. Write down a schedule and plan for your new routine.  Take time to find out what works for you. Consider: ? Joining a Camera operator, such as a biking group, yoga class, local gym, or swimming pool membership. ? Be active on your own by downloading free workout applications on a smartphone or other devices, or by purchasing workout DVDs.  If you have not been active, begin with sessions that last 10-15 minutes. Gradually work up to sessions that last 20-30 minutes, 5 times a week. Follow  all of your health care provider's recommendations.  Be patient with yourself. It takes time to build up strength and lung capacity. Safety  Exercise in an indoor, climate-controlled facility, as told by your health care provider. You may need to do this if: ? There are extreme outdoor conditions, such as heat, humidity, or cold. ? There is an air pollution advisory. Your local news, board of health, or hospital can provide information on air quality.  Take extra precautions as told by your health care provider. This may include: ? Monitoring your heart rate. ? Avoiding heavy lifting. ? Understanding how your medicines can affect you during physical activity. Certain medicines may cause heat intolerance or changes in blood sugar. ? Slowing down to rest when you need to. ? Keeping nitroglycerin spray and tablets with you at all times if you have angina. Use them as told to prevent and treat symptoms.  Drink plenty of water before, during, and after physical activity.  Know what symptoms may be signs of a problem. Stop physical activity right away if you have any of these symptoms. Get help right away if  you have any of the following during exercise:  Chest pain, shortness of breath, or feel very tired.  Pain in the arm, shoulder, neck, or jaw.  Feel weak, dizzy, or light-headed.  An irregular heart rate, or your heart rate is greater than 100 beats per minute (bpm) before exercise. These symptoms may represent a serious problem that is an emergency. Do not wait to see if the symptoms go away. Get medical help right away. Call your local emergency services (911 in the [place] Do not drive yourself to the hospital.  Summary  Physical activity has many benefits, especially if you have heart disease.  Before starting an activity program, talk with your health care provider about how often to be active and what type of activity is safe for you.  Your physical activity plan may include  moderate or vigorous aerobic activity, strengthening activities, and flexibility.  Know what symptoms may be signs of a problem. Stop physical activity right away and call emergency services (911 in the [place] if you have any of these symptoms. This information is not intended to replace advice given to you by your health care provider. Make sure you discuss any questions you have with your health care provider. Document Revised: 10/03/2017 Document Reviewed: 10/03/2017 Elsevier Patient Education  2021 [employer].

## 2021-01-13 IMAGING — MR MR CERVICAL SPINE W/O CM
5 series · 32 of 48 positions shown · non-contrast
Comparison: Cervical spine CT 08/11/2020. Cervical spine MRI
01/11/2018.

CLINICAL DATA: 58-year-old male with chronic neck pain.

EXAM:
MRI CERVICAL SPINE WITHOUT CONTRAST
TECHNIQUE: Multiplanar, multisequence MR imaging of the cervical spine was
performed. No intravenous contrast was administered.

[Series 2: T2 · sagittal · 3.0mm · 0.41mm/px · 8 of 17 slices shown (1 of 2)]
[im 1/17]
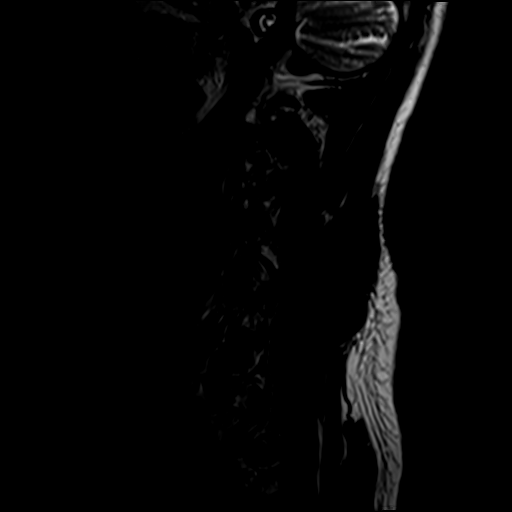
[im 3/17]
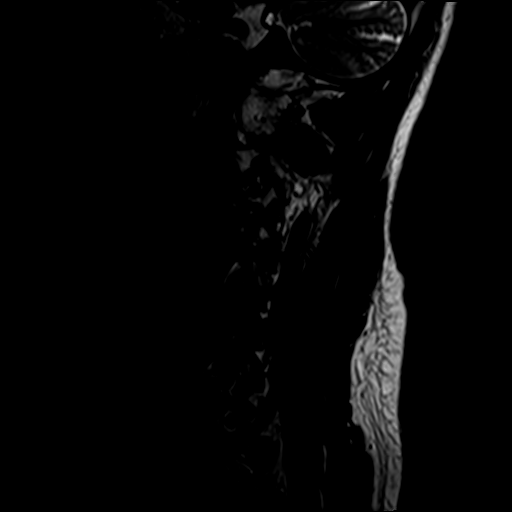
[im 5/17]
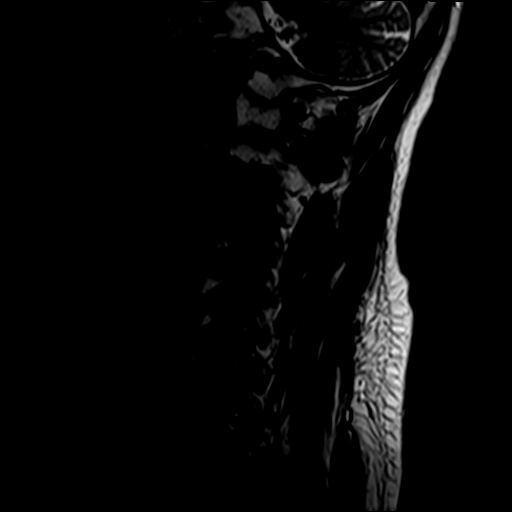
[im 7/17]
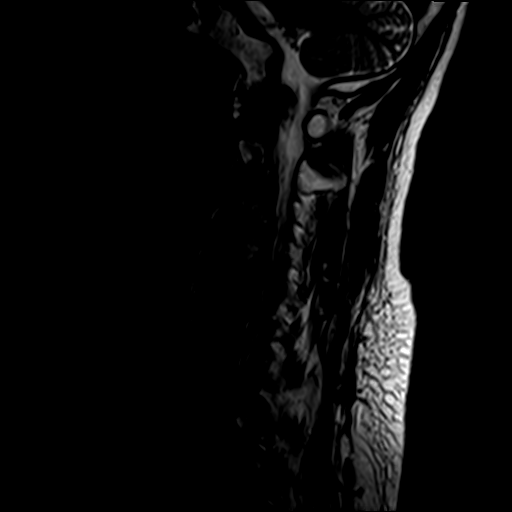
[im 10/17]
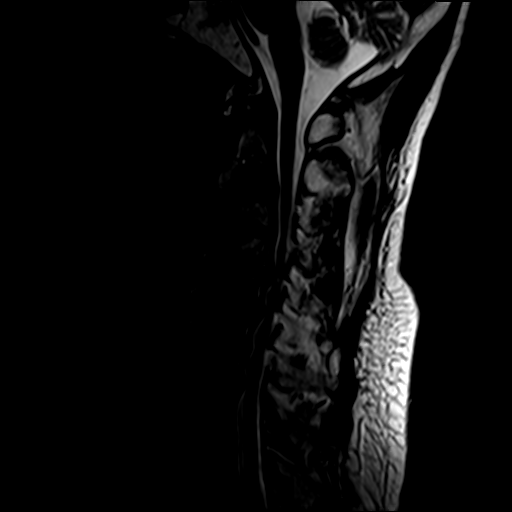
[im 12/17]
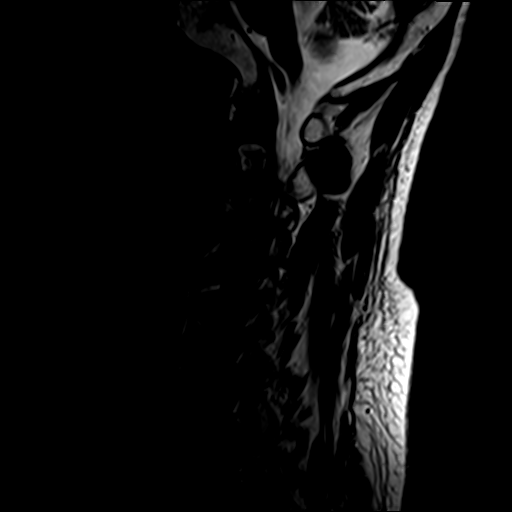
[im 14/17]
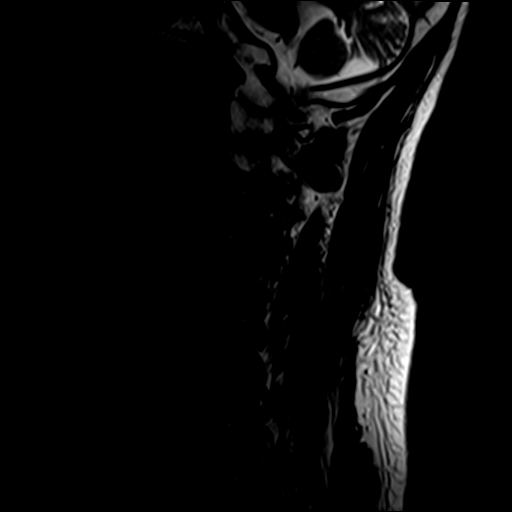
[im 17/17]
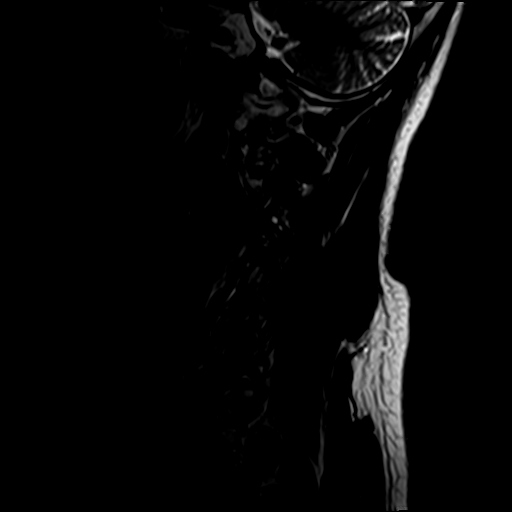

[Series 3: STIR · sagittal · 3.0mm · 0.82mm/px · 7 of 17 slices shown]
[im 1/17]
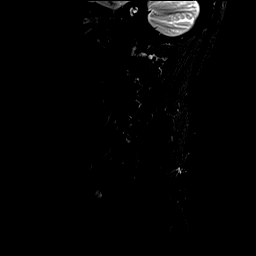
[im 3/17]
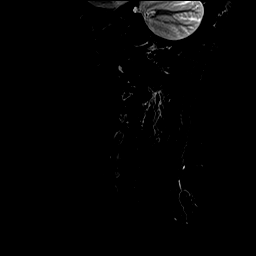
[im 6/17]
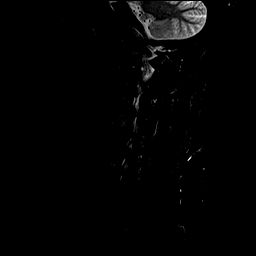
[im 9/17]
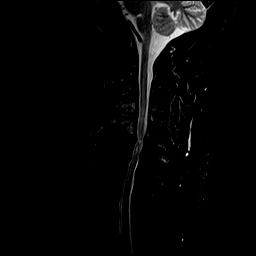
[im 11/17]
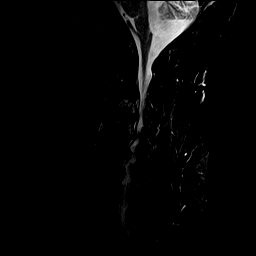
[im 14/17]
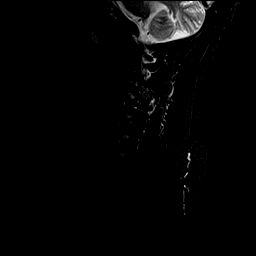
[im 17/17]
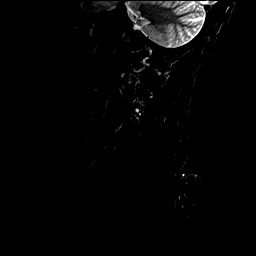

[Series 4: T1 · sagittal · 3.0mm · 0.82mm/px · 7 of 17 slices shown]
[im 1/17]
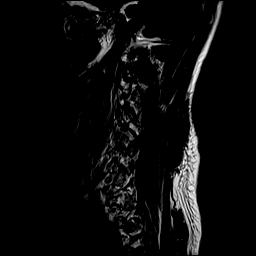
[im 3/17]
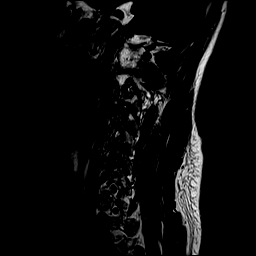
[im 6/17]
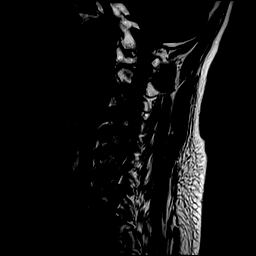
[im 9/17]
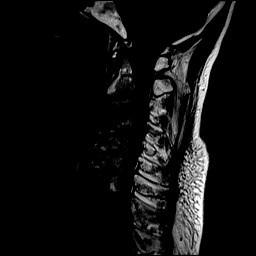
[im 11/17]
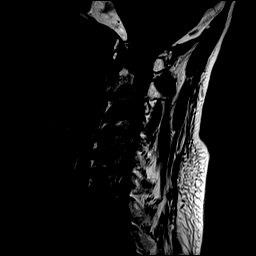
[im 14/17]
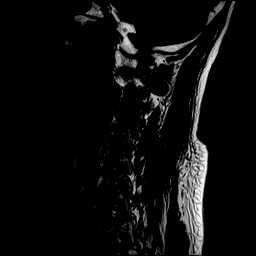
[im 17/17]
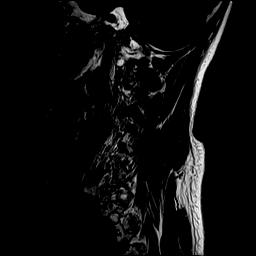

[Series 5: T2 · axial · 3.0mm · 0.70mm/px · z∈[-28,+89]mm · 8 of 32 slices shown (2 of 2)]
[im 1/32]
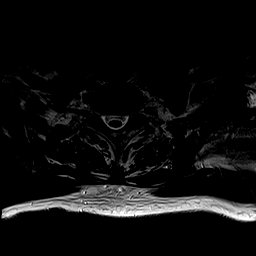
[im 5/32]
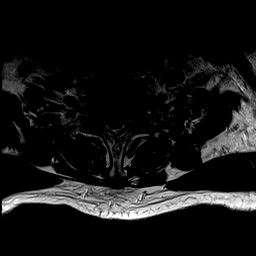
[im 10/32]
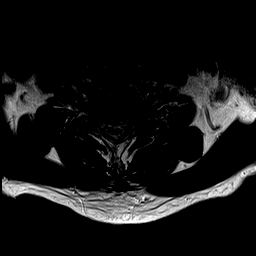
[im 15/32]
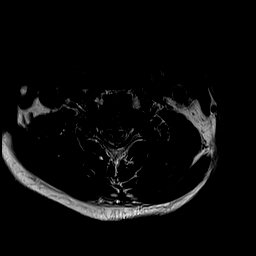
[im 17/32]
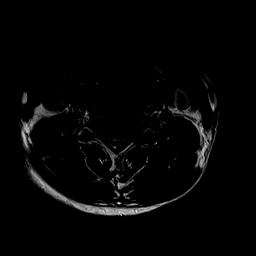
[im 22/32]
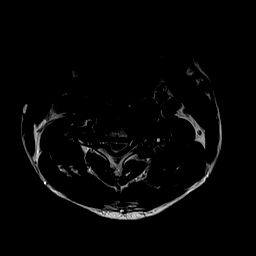
[im 27/32]
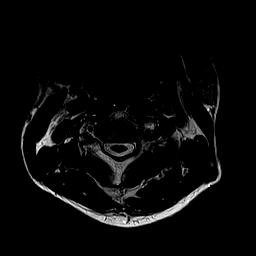
[im 32/32]
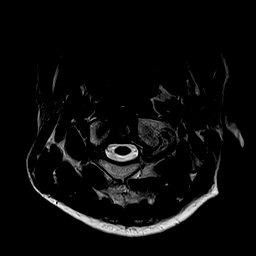

[Series 6: GRE · axial · 3.0mm · 0.35mm/px · z∈[-22,-4]mm · 2 of 29 slices shown]
[im 1/29]
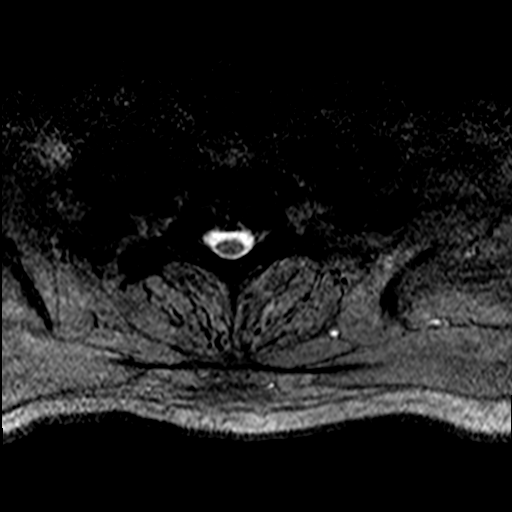
[im 6/29]
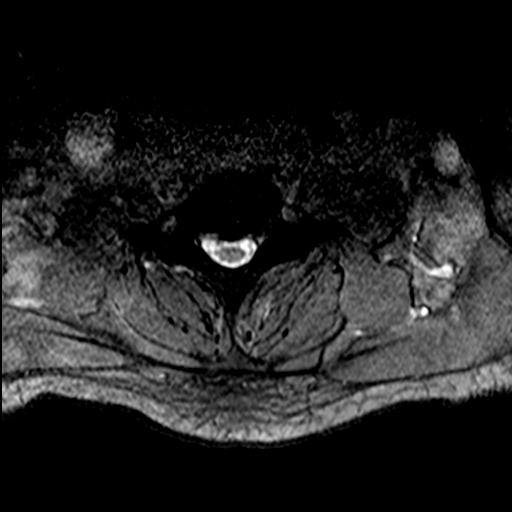

[32 of 48 positions shown; findings below may reference images not displayed]

FINDINGS: Alignment: Chronic straightening and mild reversal of cervical
lordosis. Stable since 8450.

Vertebrae: Patchy degenerative appearing marrow edema in the C3 and
C4 vertebral bodies. Resolved degenerative endplate marrow edema
seen at C5-C6 in 8450. Additional mild degenerative appearing marrow
edema in the left C2 and C3 facets today (series 3, image 14).
Mildly heterogeneous background bone marrow signal in the visible
spine. Bone marrow signal at the skull base is normal.

Cord: Abnormal. Widespread chronic T2 signal heterogeneity in the
spinal cord, associated at several levels with chronic spinal
stenosis and cord compression (series 5, image 17). The signal
changes are in keeping with multifocal chronic myelomalacia (such as
C3-C4 cord level on series 5, image 13 and C5-C6 on image 21), and
there is associated generalized cord atrophy. The visible upper
thoracic cord signal is more normal.

Posterior Fossa, vertebral arteries, paraspinal tissues:
Cervicomedullary junction is within normal limits. Negative visible
posterior fossa. Preserved major vascular flow voids in the neck.
Negative visible neck soft tissues, lung apices.

Disc levels:

Congenital spinal canal narrowing related to short pedicle distance
with the following superimposed degenerative changes.

C2-C3: Moderate to severe facet hypertrophy greater on the left.
Ligament flavum hypertrophy. Disc space loss with foraminal disc
osteophyte complex. Overall mild spinal stenosis, severe bilateral
C3 foraminal stenosis.

C3-C4: Progressed moderate to severe facet hypertrophy on the left
since 8450. Ligament flavum hypertrophy. Disc space loss with
circumferential disc osteophyte complex. Mild to moderate spinal
stenosis with mild chronic cord mass effect. Severe bilateral C4
foraminal stenosis.

C4-C5: Disc space loss with bulky circumferential disc osteophyte
complex, especially left paracentral posterior component (series 5,
image 17). Mild to moderate facet and ligament flavum hypertrophy.
Moderate to severe chronic spinal stenosis and cord mass effect.
Severe bilateral C5 foraminal stenosis.

C5-C6: Disc space loss with bulky circumferential disc osteophyte
complex. Mild facet and moderate ligament flavum hypertrophy. Mild
to moderate chronic spinal stenosis and cord mass effect. Severe
bilateral C6 foraminal stenosis.

C6-C7: Disc space loss with bulky chronic circumferential disc
osteophyte complex. Mild to moderate facet and ligament flavum
hypertrophy. Mild chronic spinal stenosis. Severe bilateral C7
foraminal stenosis.

C7-T1: Disc space loss with circumferential disc osteophyte complex.
Mild to moderate facet and ligament flavum hypertrophy. Mild chronic
spinal stenosis. No significant cord mass effect. Moderate to severe
bilateral C8 foraminal stenosis.

Congenital spinal canal narrowing mildly abates in the visible upper
thoracic spine.
IMPRESSION: 1. Chronic diffuse cervical spinal stenosis due to combined
congenital and acquired degenerative disease.
2. Widespread chronic spinal cord mass effect and multifocal Spinal
Cord Myelomalacia, most pronounced at C4-C5.
3. Associated diffuse moderate to severe cervical foraminal
stenosis.
4. Acute degenerative marrow edema at the left C2-C3 facets, C3-C4
vertebral bodies. Resolved similar edema seen in 8450 at the C5-C6
endplates.

## 2021-11-18 ENCOUNTER — Ambulatory Visit: Payer: Self-pay | Admitting: Cardiology

## 2021-12-15 NOTE — Progress Notes (Signed)
?  ?Cardiology Office Note ? ? ?Date:  12/16/2021  ? ?ID:  Donald Salas, DOB 1961-04-24, MRN [id number] ? ?PCP:  Donald Pounds, NP  ?Cardiologist:   Donald Breeding, MD ?    ? ? ?Chief Complaint  ?Patient presents with  ? Cardiomyopathy  ? ? ?  ?History of Present Illness: ?Donald Salas is a 61 y.o. male who presents for evaluation of an abnormal echo.   I saw him in 2019 when he was found to have an EF of 25 - 30% with global hypokinesis.   He had no past cardiac history otherwise.  He did have a stress perfusion study which did not suggest ischemia.  He then seems to have been lost to follow-up.  He came back fall 2021 and had a follow up echo with an EF of 45 - 50%.     ? ?He has not been seen since last year.  It does not sound like he is not taking any of his medicines.  He does not have a primary provider anymore.  He drinks about 3 40 ounce beers a week.  He denies any cardiovascular symptoms such as chest pressure, neck or arm discomfort.  He does get short of breath just walking 40 yards on level ground.  He is not really describing PND or orthopnea.  He seems to have a slow and somewhat shuffling gait.  He lives by himself.  He does not have a cell phone but he does have a home phone.  There is a number for his aunt listed and she is also contact.  He is not describing PND or orthopnea.  He is not really describing chest pressure, neck or arm discomfort.  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   ? ? ?Past Medical History:  ?Diagnosis Date  ? Alcohol addiction ([place])   ? Alcoholic cardiomyopathy ([place])   ? Cervical spondylosis with myelopathy   ? Closed left clavicular fracture when in middle school  ? Drug addiction ([place])   ? Weakness   ? ? ?Past Surgical History:  ?Procedure Laterality Date  ? LEG SURGERY    ? left chainsaw accident  ? ? ? ?Current Outpatient Medications  ?Medication Sig Dispense Refill  ? metoprolol succinate (TOPROL-XL) 50 MG 24 hr tablet Take 1 tablet (50 mg total) by mouth daily. Take with or  immediately following a meal. 90 tablet 3  ? ?No current facility-administered medications for this visit.  ? ? ?Allergies:   Patient has no known allergies.  ? ? ?ROS:  Please see the history of present illness.   Otherwise, review of systems are positive for none.   All other systems are reviewed and negative.  ? ? ?PHYSICAL EXAM: ?VS:  BP 140/88   Pulse (!) 104   Ht _0  (1.753 m)   Wt 186 lb 9.6 oz (84.6 kg)   SpO2 (!) 88%   BMI 27.56 kg/m?  , BMI Body mass index is 27.56 kg/m?. ?GENERAL:  Well appearing ?NECK:  No jugular venous distention, waveform within normal limits, carotid upstroke brisk and symmetric, no bruits, no thyromegaly ?LUNGS:  Clear to auscultation bilaterally ?CHEST:  Unremarkable ?HEART:  PMI not displaced or sustained,S1 and S2 within normal limits, no S3, no S4, no clicks, no rubs, no murmurs ?ABD:  Flat, positive bowel sounds normal in frequency in pitch, no bruits, no rebound, no guarding, no midline pulsatile mass, no hepatomegaly, no splenomegaly ?EXT:  2 plus pulses throughout, no edema,  no cyanosis no clubbing ? ? ?EKG:  EKG is  ordered today. ?The ekg ordered today demonstrates sinus rhythm, rate 104, axis within normal limits, intervals within normal limits, left ventricular hypertrophy with repolarization changes. ? ? ?Recent Labs: ?No results found for requested labs within last 8760 hours.  ? ? ?Lipid Panel ?   ?Component Value Date/Time  ? CHOL 183 06/09/2020 1415  ? TRIG 111 06/09/2020 1415  ? HDL 52 06/09/2020 1415  ? CHOLHDL 3.5 06/09/2020 1415  ? LDLCALC 111 (H) 06/09/2020 1415  ? ?  ? ?Wt Readings from Last 3 Encounters:  ?12/16/21 186 lb 9.6 oz (84.6 kg)  ?12/08/20 182 lb (82.6 kg)  ?10/19/20 183 lb 12.8 oz (83.4 kg)  ?  ? ? ?Other studies Reviewed: ?Additional studies/ records that were reviewed today include: Labs . ?Review of the above records demonstrates:  Please see elsewhere in the note.   ? ? ?ASSESSMENT AND PLAN: ? ?CARDIOMYOPATHY:      His EF was 45% but he  is off of all of his meds.  He still drinks some alcohol.  We are kind of at square 1 and I am going to start with complete blood work to include a c-Met, CBC and TSH.  I will go ahead and start Toprol-XL 50 mg daily.   We can continue then to titrate his meds based on his blood pressure and electrolytes.  Eventually we would repeat another echocardiogram.  He understands he needs to stop drinking.  ? ?AORTIC ROOT ENLARGEMENT: This was 42 mm on echo in 2021.  I might eventually follow this up with CT. ? ?HTN:  This is being managed in the context of treating his CHF  ? ?Current medicines are reviewed at length with the patient today.  The patient does not have concerns regarding medicines. ? ?The following changes have been made: None ? ?Labs/ tests ordered today include:  None ? ?Orders Placed This Encounter  ?Procedures  ? Comprehensive metabolic panel  ? CBC  ? TSH  ? EKG 12-Lead  ? ? ? ?Disposition:   FU with APP in a couple of weeks.  ? ?Signed, ?Donald Breeding, MD  ?12/16/2021 4:30 PM    ?[place] ? ?

## 2021-12-16 ENCOUNTER — Other Ambulatory Visit: Payer: Self-pay

## 2021-12-16 ENCOUNTER — Ambulatory Visit (INDEPENDENT_AMBULATORY_CARE_PROVIDER_SITE_OTHER): Payer: Medicaid Other | Admitting: Cardiology

## 2021-12-16 ENCOUNTER — Encounter: Payer: Self-pay | Admitting: Cardiology

## 2021-12-16 VITALS — BP 140/88 | HR 104 | Ht 69.0 in | Wt 186.6 lb

## 2021-12-16 DIAGNOSIS — I420 Dilated cardiomyopathy: Secondary | ICD-10-CM | POA: Diagnosis not present

## 2021-12-16 DIAGNOSIS — I7789 Other specified disorders of arteries and arterioles: Secondary | ICD-10-CM | POA: Diagnosis not present

## 2021-12-16 MED ORDER — METOPROLOL SUCCINATE ER 50 MG PO TB24
50.0000 mg | ORAL_TABLET | Freq: Every day | ORAL | 3 refills | Status: DC
  Filled 2021-12-16 (×2): qty 90, 90d supply, fill #0
  Filled 2022-05-31: qty 90, 90d supply, fill #1

## 2021-12-16 NOTE — Patient Instructions (Addendum)
Medication Instructions:  ?START metoprolol succinate (Toprol XL) 50 mg daily ? ?*If you need a refill on your cardiac medications before your next appointment, please call your pharmacy* ? ? ?Lab Work: ?CMET, CBC, TSH today ? ?If you have labs (blood work) drawn today and your tests are completely normal, you will receive your results only by: ?MyChart Message (if you have MyChart) OR ?A paper copy in the mail ?If you have any lab test that is abnormal or we need to change your treatment, we will call you to review the results. ? ?Follow-Up: ?At [facility], you and your health needs are our priority.  As part of our continuing mission to provide you with exceptional heart care, we have created designated Provider Care Teams.  These Care Teams include your primary Cardiologist (physician) and Advanced Practice Providers (APPs -  Physician Assistants and Nurse Practitioners) who all work together to provide you with the care you need, when you need it. ? ?We recommend signing up for the patient portal called "MyChart".  Sign up information is provided on this After Visit Summary.  MyChart is used to connect with patients for Virtual Visits (Telemedicine).  Patients are able to view lab/test results, encounter notes, upcoming appointments, etc.  Non-urgent messages can be sent to your provider as well.   ?To learn more about what you can do with MyChart, go to [email or web address].   ? ?Your next appointment:   ?Next available with Edd Fabian NP ? ? ?

## 2021-12-17 LAB — CBC
Hematocrit: 49.9 % (ref 37.5–51.0)
Hemoglobin: 16.1 g/dL (ref 13.0–17.7)
MCH: 29.3 pg (ref 26.6–33.0)
MCHC: 32.3 g/dL (ref 31.5–35.7)
MCV: 91 fL (ref 79–97)
Platelets: 163 10*3/uL (ref 150–450)
RBC: 5.5 x10E6/uL (ref 4.14–5.80)
RDW: 12.9 % (ref 11.6–15.4)
WBC: 7.5 10*3/uL (ref 3.4–10.8)

## 2021-12-17 LAB — COMPREHENSIVE METABOLIC PANEL
ALT: 31 IU/L (ref 0–44)
AST: 29 IU/L (ref 0–40)
Albumin/Globulin Ratio: 1.7 (ref 1.2–2.2)
Albumin: 4.5 g/dL (ref 3.8–4.8)
Alkaline Phosphatase: 82 IU/L (ref 44–121)
BUN/Creatinine Ratio: 16 (ref 10–24)
BUN: 14 mg/dL (ref 8–27)
Bilirubin Total: 0.5 mg/dL (ref 0.0–1.2)
CO2: 28 mmol/L (ref 20–29)
Calcium: 9.9 mg/dL (ref 8.6–10.2)
Chloride: 98 mmol/L (ref 96–106)
Creatinine, Ser: 0.87 mg/dL (ref 0.76–1.27)
Globulin, Total: 2.7 g/dL (ref 1.5–4.5)
Glucose: 91 mg/dL (ref 70–99)
Potassium: 4.9 mmol/L (ref 3.5–5.2)
Sodium: 138 mmol/L (ref 134–144)
Total Protein: 7.2 g/dL (ref 6.0–8.5)
eGFR: 98 mL/min/{1.73_m2} (ref >59)

## 2021-12-17 LAB — TSH: TSH: 1.58 u[IU]/mL (ref 0.450–4.500)

## 2021-12-23 NOTE — Progress Notes (Deleted)
? ?Cardiology Clinic Note  ? ?Patient Name: Donald Salas ?Date of Encounter: 12/23/2021 ? ?Primary Care Provider:  [name], NP ?Primary Cardiologist:  [name], MD ? ?Patient Profile  ?  ?Donald Salas 61 year old male presents the clinic for follow-up evaluation of his dilated/alcoholic cardiomyopathy and aortic root enlargement. ? ?Past Medical History  ?  ?Past Medical History:  ?Diagnosis Date  ? Alcohol addiction ([place])   ? Alcoholic cardiomyopathy ([place])   ? Cervical spondylosis with myelopathy   ? Closed left clavicular fracture when in middle school  ? Drug addiction ([place])   ? Weakness   ? ?Past Surgical History:  ?Procedure Laterality Date  ? LEG SURGERY    ? left chainsaw accident  ? ? ?Allergies ? ?No Known Allergies ? ?History of Present Illness  ?  ?Donald Salas has a PMH of dilated cardiomyopathy with an EF of 25-30% showing global hypokinesis.  He has no other past cardiac history.  His nuclear stress test showed no ischemia.  He was lost to follow-up.  He was seen back in the clinic fall 2021 and had a follow-up echocardiogram which showed an EF of 45-50%. ? ?He was seen by [name] 10/19/2020.  He reported he had been drinking 1 beer per week.  He denied chest pain, neck discomfort, and arm discomfort.  He denied new shortness of breath, PND and orthopnea.  He denied palpitations, presyncope, and syncope.  He was noted to have 1 girdle weakness, back problems, and joint problems. ? ?He presented to the clinic 12/08/20 for follow-up evaluation stated he had pain in his back and shoulders.  He also noticed pain in his right leg.  These were related to orthopedic issues that he  had for quite some time.  He reported that he had been out of his lisinopril and his metoprolol for around 3 days.  His heart rate was slightly elevated  at 107 and 98 on recheck.  He reported he was limited in his physical activity due to his back shoulder and leg pain.  His blood pressure was well controlled  at 120/88.  He reported that he ate a heart healthy low-sodium diet and did not add salt to his food.  I  asked him to increase his physical activity as tolerated, gave him the heart healthy low-sodium diet paperwork, and planned follow-up in 6 months. ? ?He was seen in follow-up by [name] on 12/16/2021.  He had not been taking his medications.  He no longer had a PCP.  He was drinking 340 ounce beers per week.  He denied cardiac symptoms.  He noted shortness of breath with walking 40 yards on level ground.  He denied orthopnea and PND.  He was noted to have a slow and shuffling gait.  He was living by himself.  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  He was started on metoprolol XL 50 mg daily, echocardiogram was ordered and he was instructed to stop drinking.  His echocardiogram has not yet been completed.  His lab work from 12/16/2028 was unremarkable. ? ?He presents to the clinic today for follow-up evaluation states*** ? ?Today he denies chest pain, shortness of breath, lower extremity edema, fatigue, palpitations, melena, hematuria, hemoptysis, diaphoresis, weakness, presyncope, syncope, orthopnea, and PND. ? ? ?Home Medications  ?  ?Prior to Admission medications   ?Medication Sig Start Date End Date Taking? Authorizing Provider  ?lisinopril (ZESTRIL) 10 MG tablet Take 1 tablet (10 mg total) by mouth  daily. 10/19/20 01/17/21  [name], MD  ?metoprolol succinate (TOPROL XL) 50 MG 24 hr tablet Take 1 tablet (50 mg total) by mouth daily. 10/19/20   [name], MD  ? ? ?Family History  ?  ?Family History  ?Problem Relation Age of Onset  ? Breast cancer Mother   ? Other Father   ?     unknown history  ? Asthma Brother   ? Eczema Brother   ? ?He indicated that his mother is deceased. He indicated that his father is deceased. He indicated that his brother is alive. ? ?Social History  ?  ?Social History  ? ?Socioeconomic History  ? Marital status: Single  ?  Spouse name: Not on file  ? Number of children: 5  ?  Years of education: 11  ? Highest education level: High school graduate  ?Occupational History  ? Occupation: Unemployed  ?Tobacco Use  ? Smoking status: Every Day  ?  Packs/day: 1.00  ?  Years: 79.00  ?  Pack years: 79.00  ?  Types: Cigarettes  ?  Start date: 09/09/1969  ? Smokeless tobacco: Never  ?Vaping Use  ? Vaping Use: Never used  ?Substance and Sexual Activity  ? Alcohol use: Yes  ?  Alcohol/week: 0.0 standard drinks  ?  Comment: 1-4 - 40oz malt liquor per day  ? Drug use: Yes  ?  Types: Cocaine  ?  Comment: Crack cocaine every day--smokes, Rare MJ.  Was in prison 4-5 years ago for 4 months, but started using after released.  No treatment otherwise.    ? Sexual activity: Never  ?Other Topics Concern  ? Not on file  ?Social History Narrative  ? Born and raised in [place].  ? Graduated from [employer].  ? Was in [employer].    ? Started drugs with different women he was with throughout life.   ? Lives alone in a house.  Not clear how he supports his habits.   ? Right-handed.  ? No caffeine use.  ? ?Social Determinants of Health  ? ?Financial Resource Strain: Not on file  ?Food Insecurity: Not on file  ?Transportation Needs: Not on file  ?Physical Activity: Not on file  ?Stress: Not on file  ?Social Connections: Not on file  ?Intimate Partner Violence: Not on file  ?  ? ?Review of Systems  ?  ?General:  No chills, fever, night sweats or weight changes.  ?Cardiovascular:  No chest pain, dyspnea on exertion, edema, orthopnea, palpitations, paroxysmal nocturnal dyspnea. ?Dermatological: No rash, lesions/masses ?Respiratory: No cough, dyspnea ?Urologic: No hematuria, dysuria ?Abdominal:   No nausea, vomiting, diarrhea, bright red blood per rectum, melena, or hematemesis ?Neurologic:  No visual changes, wkns, changes in mental status. ?All other systems reviewed and are otherwise negative except as noted above. ? ?Physical Exam  ?  ?VS:  There were no vitals taken for this visit. , BMI There is no height or weight  on file to calculate BMI. ?GEN: Well nourished, well developed, in no acute distress. ?HEENT: normal. ?Neck: Supple, no JVD, carotid bruits, or masses. ?Cardiac: RRR, no murmurs, rubs, or gallops. No clubbing, cyanosis, edema.  Radials/DP/PT 2+ and equal bilaterally.  ?Respiratory:  Respirations regular and unlabored, clear to auscultation bilaterally. ?GI: Soft, nontender, nondistended, BS + x 4. ?MS: no deformity or atrophy. ?Skin: warm and dry, no rash. ?Neuro:  Strength and sensation are intact. ?Psych: Normal affect. ? ?Accessory Clinical Findings  ?  ?Recent Labs: ?12/16/2021: ALT 31; BUN  14; Creatinine, Ser 0.87; Hemoglobin 16.1; Platelets 163; Potassium 4.9; Sodium 138; TSH 1.580  ? ?Recent Lipid Panel ?   ?Component Value Date/Time  ? CHOL 183 06/09/2020 1415  ? TRIG 111 06/09/2020 1415  ? HDL 52 06/09/2020 1415  ? CHOLHDL 3.5 06/09/2020 1415  ? LDLCALC 111 (H) 06/09/2020 1415  ? ? ?ECG personally reviewed by me today-none today. ? ?Echocardiogram 08/10/2020 ?IMPRESSIONS  ? ? ? 1. Left ventricular ejection fraction, by estimation, is 45 to 50%. The  ?left ventricle has mildly decreased function. The left ventricle  ?demonstrates global hypokinesis. There is mild concentric left ventricular  ?hypertrophy. Left ventricular diastolic  ?parameters are consistent with Grade I diastolic dysfunction (impaired  ?relaxation).  ? 2. Right ventricular systolic function is normal. The right ventricular  ?size is normal. Tricuspid regurgitation signal is inadequate for assessing  ?PA pressure.  ? 3. The mitral valve is normal in structure. No evidence of mitral valve  ?regurgitation. No evidence of mitral stenosis.  ? 4. The aortic valve is normal in structure. Aortic valve regurgitation is  ?not visualized. No aortic stenosis is present.  ? 5. Aortic dilatation noted. 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  ? 6. The inferior vena cava is normal in size with greater than 50%  ?respiratory  variability, suggesting right atrial pressure of 3 mmHg.  ? ?Comparison(s): Prior images unable to be directly viewed, comparison made  ?by report only. The left ventricular function has improved.  ? ?As

## 2021-12-27 ENCOUNTER — Ambulatory Visit: Payer: Medicaid Other | Admitting: General Practice

## 2022-01-24 NOTE — Progress Notes (Signed)
? ?Cardiology Clinic Note  ? ?Patient Name: Donald Salas ?Date of Encounter: 01/27/2022 ? ?Primary Care Provider:  [name], NP ?Primary Cardiologist:  [name], MD ? ?Patient Profile  ?  ?Donald Salas 61 year old male presents the clinic for follow-up evaluation of his dilated/alcoholic cardiomyopathy. ? ?Past Medical History  ?  ?Past Medical History:  ?Diagnosis Date  ? Alcohol addiction ([place])   ? Alcoholic cardiomyopathy ([place])   ? Cervical spondylosis with myelopathy   ? Closed left clavicular fracture when in middle school  ? Drug addiction ([place])   ? Weakness   ? ?Past Surgical History:  ?Procedure Laterality Date  ? LEG SURGERY    ? left chainsaw accident  ? ? ?Allergies ? ?No Known Allergies ? ?History of Present Illness  ?  ?Mr. Donald Salas has a PMH of dilated cardiomyopathy with an EF of 25-30% showing global hypokinesis.  He has no other past cardiac history.  His nuclear stress test showed no ischemia.  He was lost to follow-up.  He was seen back in the clinic fall 2021 and had a follow-up echocardiogram which showed an EF of 45-50%. ? ?He was seen by [name] 10/19/2020.  He reported he had been drinking 1 beer per week.  He denied chest pain, neck discomfort, and arm discomfort.  He denied new shortness of breath, PND and orthopnea.  He denied palpitations, presyncope, and syncope.  He was noted to have 1 girdle weakness, back problems, and joint problems. ? ?He presented to the clinic 12/08/20 for follow-up evaluation stated he had pain in his back and shoulders.  He also noticed pain in his right leg.  These were related to orthopedic issues that he  had for quite some time.  He reported that he had been out of his lisinopril and his metoprolol for around 3 days.  His heart rate was slightly elevated  at 107 and 98 on recheck.  He reported he was limited in his physical activity due to his back shoulder and leg pain.  His blood pressure was well controlled at 120/88.  He reported that  he ate a heart healthy low-sodium diet and did not add salt to his food.  I  asked him to increase his physical activity as tolerated, gave him the heart healthy low-sodium diet paperwork, and planned follow-up in 6 months. ? ?He was seen in follow-up by [name] on 12/16/2021.  He had not been taking his medications.  He no longer had a PCP.  He was drinking 340 ounce beers per week.  He denied cardiac symptoms.  He noted shortness of breath with walking 40 yards on level ground.  He denied orthopnea and PND.  He was noted to have a slow and shuffling gait.  He was living by himself.  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  He was started on metoprolol XL 50 mg daily, echocardiogram was ordered and he was instructed to stop drinking.  His echocardiogram has not yet been completed.  His lab work from 12/16/2028 was unremarkable. ? ?He presents to the clinic today for follow-up evaluation states he feels well today.  He is limited in his physical activity due to his right knee stiffness.  He reports that he has dietary discretion and eats a regular diet.  Initially his blood pressure today is 160/88 and on recheck is 136/84.  He reports compliance with his metoprolol medication.  He reported that he did not receive a call or have  his echocardiogram scheduled.  I stressed the importance of follow-up echocardiogram and will start him on losartan 25 mg today.  He continues to drink about 40 ounces of beer daily.  I stressed the importance of cutting back and quitting.  He expressed understanding.  We will have him come back in 1 to 2 weeks for BMP and plan follow-up for 1 to 2 months after his echocardiogram. ? ?Today he denies chest pain, shortness of breath, lower extremity edema, fatigue, palpitations, melena, hematuria, hemoptysis, diaphoresis, weakness, presyncope, syncope, orthopnea, and PND. ? ? ?Home Medications  ?  ?Prior to Admission medications   ?Medication Sig Start Date End Date Taking? Authorizing Provider   ?lisinopril (ZESTRIL) 10 MG tablet Take 1 tablet (10 mg total) by mouth daily. 10/19/20 01/17/21  [name], MD  ?metoprolol succinate (TOPROL XL) 50 MG 24 hr tablet Take 1 tablet (50 mg total) by mouth daily. 10/19/20   [name], MD  ? ? ?Family History  ?  ?Family History  ?Problem Relation Age of Onset  ? Breast cancer Mother   ? Other Father   ?     unknown history  ? Asthma Brother   ? Eczema Brother   ? ?He indicated that his mother is deceased. He indicated that his father is deceased. He indicated that his brother is alive. ? ?Social History  ?  ?Social History  ? ?Socioeconomic History  ? Marital status: Single  ?  Spouse name: Not on file  ? Number of children: 5  ? Years of education: 35  ? Highest education level: High school graduate  ?Occupational History  ? Occupation: Unemployed  ?Tobacco Use  ? Smoking status: Every Day  ?  Packs/day: 1.00  ?  Years: 79.00  ?  Pack years: 79.00  ?  Types: Cigarettes  ?  Start date: 09/09/1969  ? Smokeless tobacco: Never  ?Vaping Use  ? Vaping Use: Never used  ?Substance and Sexual Activity  ? Alcohol use: Yes  ?  Alcohol/week: 0.0 standard drinks  ?  Comment: 1-4 - 40oz malt liquor per day  ? Drug use: Yes  ?  Types: Cocaine  ?  Comment: Crack cocaine every day--smokes, Rare MJ.  Was in prison 4-5 years ago for 4 months, but started using after released.  No treatment otherwise.    ? Sexual activity: Never  ?Other Topics Concern  ? Not on file  ?Social History Narrative  ? Born and raised in [place].  ? Graduated from [employer].  ? Was in [employer].    ? Started drugs with different women he was with throughout life.   ? Lives alone in a house.  Not clear how he supports his habits.   ? Right-handed.  ? No caffeine use.  ? ?Social Determinants of Health  ? ?Financial Resource Strain: Not on file  ?Food Insecurity: Not on file  ?Transportation Needs: Not on file  ?Physical Activity: Not on file  ?Stress: Not on file  ?Social Connections: Not on file   ?Intimate Partner Violence: Not on file  ?  ? ?Review of Systems  ?  ?General:  No chills, fever, night sweats or weight changes.  ?Cardiovascular:  No chest pain, dyspnea on exertion, edema, orthopnea, palpitations, paroxysmal nocturnal dyspnea. ?Dermatological: No rash, lesions/masses ?Respiratory: No cough, dyspnea ?Urologic: No hematuria, dysuria ?Abdominal:   No nausea, vomiting, diarrhea, bright red blood per rectum, melena, or hematemesis ?Neurologic:  No visual changes, wkns, changes in mental status. ?  All other systems reviewed and are otherwise negative except as noted above. ? ?Physical Exam  ?  ?VS:  BP 136/84   Pulse 78   Resp 18   Ht 5\' 9"  (1.753 m)   Wt 183 lb 6.4 oz (83.2 kg)   SpO2 96%   BMI 27.08 kg/m?  , BMI Body mass index is 27.08 kg/m?. ?GEN: Well nourished, well developed, in no acute distress. ?HEENT: normal. ?Neck: Supple, no JVD, carotid bruits, or masses. ?Cardiac: RRR, no murmurs, rubs, or gallops. No clubbing, cyanosis, edema.  Radials/DP/PT 2+ and equal bilaterally.  ?Respiratory:  Respirations regular and unlabored, clear to auscultation bilaterally. ?GI: Soft, nontender, nondistended, BS + x 4. ?MS: no deformity or atrophy. ?Skin: warm and dry, no rash. ?Neuro:  Strength and sensation are intact. ?Psych: Normal affect. ? ?Accessory Clinical Findings  ?  ?Recent Labs: ?12/16/2021: ALT 31; BUN 14; Creatinine, Ser 0.87; Hemoglobin 16.1; Platelets 163; Potassium 4.9; Sodium 138; TSH 1.580  ? ?Recent Lipid Panel ?   ?Component Value Date/Time  ? CHOL 183 06/09/2020 1415  ? TRIG 111 06/09/2020 1415  ? HDL 52 06/09/2020 1415  ? CHOLHDL 3.5 06/09/2020 1415  ? LDLCALC 111 (H) 06/09/2020 1415  ? ? ?ECG personally reviewed by me today-none today. ? ?Echocardiogram 08/10/2020 ?IMPRESSIONS  ? ? ? 1. Left ventricular ejection fraction, by estimation, is 45 to 50%. The  ?left ventricle has mildly decreased function. The left ventricle  ?demonstrates global hypokinesis. There is mild concentric  left ventricular  ?hypertrophy. Left ventricular diastolic  ?parameters are consistent with Grade I diastolic dysfunction (impaired  ?relaxation).  ? 2. Right ventricular systolic function is normal. The right v

## 2022-01-27 ENCOUNTER — Encounter: Payer: Self-pay | Admitting: General Practice

## 2022-01-27 ENCOUNTER — Ambulatory Visit (INDEPENDENT_AMBULATORY_CARE_PROVIDER_SITE_OTHER): Payer: Medicaid Other | Admitting: General Practice

## 2022-01-27 ENCOUNTER — Other Ambulatory Visit: Payer: Self-pay

## 2022-01-27 VITALS — BP 136/84 | HR 78 | Resp 18 | Ht 69.0 in | Wt 183.4 lb

## 2022-01-27 DIAGNOSIS — I10 Essential (primary) hypertension: Secondary | ICD-10-CM | POA: Diagnosis not present

## 2022-01-27 DIAGNOSIS — I7789 Other specified disorders of arteries and arterioles: Secondary | ICD-10-CM | POA: Diagnosis not present

## 2022-01-27 DIAGNOSIS — I426 Alcoholic cardiomyopathy: Secondary | ICD-10-CM

## 2022-01-27 DIAGNOSIS — I420 Dilated cardiomyopathy: Secondary | ICD-10-CM | POA: Diagnosis not present

## 2022-01-27 MED ORDER — LOSARTAN POTASSIUM 25 MG PO TABS
25.0000 mg | ORAL_TABLET | Freq: Every day | ORAL | 3 refills | Status: DC
  Filled 2022-01-27: qty 90, 90d supply, fill #0

## 2022-01-27 NOTE — Patient Instructions (Signed)
Medication Instructions:  ?Start taking losartan 25 mg each day. ?*If you need a refill on your cardiac medications before your next appointment, please call your pharmacy* ? ? ?Lab Work: ?In 2 weeks, return for blood work. ?If you have labs (blood work) drawn today and your tests are completely normal, you will receive your results only by: ?MyChart Message (if you have MyChart) OR ?A paper copy in the mail ?If you have any lab test that is abnormal or we need to change your treatment, we will call you to review the results. ? ? ?Testing/Procedures: ?Your physician has requested that you have an echocardiogram. Echocardiography is a painless test that uses sound waves to create images of your heart. It provides your doctor with information about the size and shape of your heart and how well your heart's chambers and valves are working. This procedure takes approximately one hour. There are no restrictions for this procedure. ? ?Follow-Up: ?At [facility], you and your health needs are our priority.  As part of our continuing mission to provide you with exceptional heart care, we have created designated Provider Care Teams.  These Care Teams include your primary Cardiologist (physician) and Advanced Practice Providers (APPs -  Physician Assistants and Nurse Practitioners) who all work together to provide you with the care you need, when you need it. ? ?We recommend signing up for the patient portal called "MyChart".  Sign up information is provided on this After Visit Summary.  MyChart is used to connect with patients for Virtual Visits (Telemedicine).  Patients are able to view lab/test results, encounter notes, upcoming appointments, etc.  Non-urgent messages can be sent to your provider as well.   ?To learn more about what you can do with MyChart, go to [email or web address].   ? ?Your next appointment:   ?2 month(s) after echo ? ?The format for your next appointment:   ?In Person ? ?Provider:   ?Edd Fabian, FNP     ? ? ?Other Instructions ?Follow the Salty 6 diet provided to you. ?2.   Keep a diary of your blood pressure. If your top number is consistently less than 100 or if your blood pressure is higher than 130/80 consistently, please call our office. ? ? ?

## 2022-01-30 ENCOUNTER — Other Ambulatory Visit: Payer: Self-pay

## 2022-02-10 ENCOUNTER — Ambulatory Visit (HOSPITAL_COMMUNITY): Payer: Medicaid Other | Attending: Cardiology

## 2022-02-10 DIAGNOSIS — I426 Alcoholic cardiomyopathy: Secondary | ICD-10-CM | POA: Diagnosis not present

## 2022-02-10 DIAGNOSIS — I7789 Other specified disorders of arteries and arterioles: Secondary | ICD-10-CM

## 2022-02-10 DIAGNOSIS — I420 Dilated cardiomyopathy: Secondary | ICD-10-CM

## 2022-02-10 DIAGNOSIS — I10 Essential (primary) hypertension: Secondary | ICD-10-CM | POA: Diagnosis not present

## 2022-02-10 LAB — ECHOCARDIOGRAM COMPLETE
Area-P 1/2: 1.8 cm2
S' Lateral: 2.9 cm

## 2022-02-10 MED ORDER — PERFLUTREN LIPID MICROSPHERE
1.0000 mL | INTRAVENOUS | Status: AC | PRN
  Administered 2022-02-10: 2 mL via INTRAVENOUS

## 2022-02-13 ENCOUNTER — Other Ambulatory Visit: Payer: Self-pay

## 2022-02-13 DIAGNOSIS — I513 Intracardiac thrombosis, not elsewhere classified: Secondary | ICD-10-CM

## 2022-03-01 ENCOUNTER — Ambulatory Visit: Payer: Medicaid Other | Attending: Nurse Practitioner | Admitting: Nurse Practitioner

## 2022-03-01 ENCOUNTER — Encounter: Payer: Self-pay | Admitting: Nurse Practitioner

## 2022-03-01 ENCOUNTER — Other Ambulatory Visit: Payer: Self-pay

## 2022-03-01 VITALS — BP 143/83 | HR 92 | Wt 183.8 lb

## 2022-03-01 DIAGNOSIS — F5101 Primary insomnia: Secondary | ICD-10-CM

## 2022-03-01 DIAGNOSIS — Z720 Tobacco use: Secondary | ICD-10-CM

## 2022-03-01 DIAGNOSIS — R7303 Prediabetes: Secondary | ICD-10-CM | POA: Diagnosis not present

## 2022-03-01 DIAGNOSIS — I10 Essential (primary) hypertension: Secondary | ICD-10-CM

## 2022-03-01 DIAGNOSIS — J449 Chronic obstructive pulmonary disease, unspecified: Secondary | ICD-10-CM

## 2022-03-01 DIAGNOSIS — F17210 Nicotine dependence, cigarettes, uncomplicated: Secondary | ICD-10-CM

## 2022-03-01 DIAGNOSIS — E785 Hyperlipidemia, unspecified: Secondary | ICD-10-CM

## 2022-03-01 LAB — POCT GLYCOSYLATED HEMOGLOBIN (HGB A1C): HbA1c, POC (prediabetic range): 6 % (ref 5.7–6.4)

## 2022-03-01 LAB — GLUCOSE, POCT (MANUAL RESULT ENTRY): POC Glucose: 145 mg/dl — AB (ref 70–99)

## 2022-03-01 MED ORDER — ALBUTEROL SULFATE HFA 108 (90 BASE) MCG/ACT IN AERS
2.0000 | INHALATION_SPRAY | Freq: Four times a day (QID) | RESPIRATORY_TRACT | 2 refills | Status: DC | PRN
  Filled 2022-03-01: qty 18, 25d supply, fill #0
  Filled 2022-05-31: qty 18, 25d supply, fill #1

## 2022-03-01 MED ORDER — MOMETASONE FURO-FORMOTEROL FUM 100-5 MCG/ACT IN AERO
2.0000 | INHALATION_SPRAY | Freq: Two times a day (BID) | RESPIRATORY_TRACT | 3 refills | Status: DC
  Filled 2022-03-01: qty 13, 30d supply, fill #0

## 2022-03-01 MED ORDER — TRAZODONE HCL 50 MG PO TABS
50.0000 mg | ORAL_TABLET | Freq: Every evening | ORAL | 3 refills | Status: DC | PRN
  Filled 2022-03-01: qty 30, 30d supply, fill #0
  Filled 2022-05-31: qty 30, 30d supply, fill #1

## 2022-03-01 NOTE — Progress Notes (Signed)
Assessment & Plan:  Smayan was seen today for hypertension and insomnia.  Diagnoses and all orders for this visit:  Prediabetes -     CMP14+EGFR -     POCT glucose (manual entry) -     POCT glycosylated hemoglobin (Hb A1C)  Primary hypertension -     CMP14+EGFR  COPD mixed type (HCC) -     albuterol (VENTOLIN HFA) 108 (90 Base) MCG/ACT inhaler; Inhale 2 puffs into the lungs every 6 (six) hours as needed for wheezing or shortness of breath. -     mometasone-formoterol (DULERA) 100-5 MCG/ACT AERO; Inhale 2 puffs into the lungs 2 (two) times daily.  Primary insomnia -     traZODone (DESYREL) 50 MG tablet; Take 1 tablet (50 mg total) by mouth at bedtime as needed for sleep.  Tobacco abuse disorder -     CT CHEST LUNG CA SCREEN LOW DOSE W/O CM; Future  Dyslipidemia, goal LDL below 100 -     Lipid panel    Patient has been counseled on age-appropriate routine health concerns for screening and prevention. These are reviewed and up-to-date. Referrals have been placed accordingly. Immunizations are up-to-date or declined.    Subjective:   Chief Complaint  Patient presents with   Hypertension   Insomnia   HPI Donald Salas 61 y.o. male presents to office today for follow up. I have not seen him since 2021 He has a past medical history of Alcohol addiction, Alcoholic cardiomyopathy, Cervical spondylosis with myelopathy, Closed left clavicular fracture (when in middle school), Drug addiction (cocaine), ETOH overuse.   Currently followed by Cardiology for his history of CM/CHF EF 25-30%.  Recently started on losartan 25 mg daily on 01/27/2022 by cardiology.  He is also currently taking metoprolol XL 50 mg daily.  Cardiac MRI pending due to possible apical thrombus.    HTN Blood pressure is slightly elevated today.  He endorses adherence taking losartan 25 mg daily and Toprol-XL 50 mg daily. If creatinine stable will increase losartan to 80m daily.  BP Readings from Last 3  Encounters:  03/01/22 (!) 143/83  01/27/22 136/84  12/16/21 140/88    Insomnia He has trouble staying asleep but not falling asleep. He has never tried a sleep aid before.  Denies any symptoms of sleep apnea and states he does not drink caffeine at night.  He does nap several times during the day but reports this is only for a few minutes at a time.  COPD Smoker for over 513years. Used to smoke 2 ppd and now 1/2 ppd. Notes intermittent shortness of breath with exertion. Has chronic intermittent productive cough.   Prediabetes Well controlled with diet only at this time. Lab Results  Component Value Date   HGBA1C 6.0 03/01/2022    Review of Systems  Constitutional:  Negative for fever, malaise/fatigue and weight loss.  HENT: Negative.  Negative for nosebleeds.   Eyes: Negative.  Negative for blurred vision, double vision and photophobia.  Respiratory:  Positive for cough and sputum production. Negative for hemoptysis, shortness of breath and wheezing.   Cardiovascular: Negative.  Negative for chest pain, palpitations and leg swelling.  Gastrointestinal: Negative.  Negative for heartburn, nausea and vomiting.  Musculoskeletal: Negative.  Negative for myalgias.  Neurological: Negative.  Negative for dizziness, focal weakness, seizures and headaches.  Psychiatric/Behavioral:  Negative for suicidal ideas. The patient has insomnia.    Past Medical History:  Diagnosis Date   Alcohol addiction ([place]    Alcoholic  cardiomyopathy ([place])    Cervical spondylosis with myelopathy    Closed left clavicular fracture when in middle school   Drug addiction ([place])    Weakness     Past Surgical History:  Procedure Laterality Date   LEG SURGERY     left chainsaw accident    Family History  Problem Relation Age of Onset   Breast cancer Mother    Other Father        unknown history   Asthma Brother    Eczema Brother     Social History Reviewed with no changes to be made today.    Outpatient Medications Prior to Visit  Medication Sig Dispense Refill   losartan (COZAAR) 25 MG tablet Take 1 tablet (25 mg total) by mouth daily. 90 tablet 3   metoprolol succinate (TOPROL-XL) 50 MG 24 hr tablet Take 1 tablet (50 mg total) by mouth daily. Take with or immediately following a meal. 90 tablet 3   No facility-administered medications prior to visit.    No Known Allergies     Objective:    BP (!) 143/83   Pulse 92   Wt 183 lb 12.8 oz (83.4 kg)   SpO2 94%   BMI 27.14 kg/m  Wt Readings from Last 3 Encounters:  03/01/22 183 lb 12.8 oz (83.4 kg)  01/27/22 183 lb 6.4 oz (83.2 kg)  12/16/21 186 lb 9.6 oz (84.6 kg)    Physical Exam Vitals and nursing note reviewed.  Constitutional:      Appearance: He is well-developed.  HENT:     Head: Normocephalic and atraumatic.  Cardiovascular:     Rate and Rhythm: Normal rate and regular rhythm.     Heart sounds: Normal heart sounds. No murmur heard.   No friction rub. No gallop.  Pulmonary:     Effort: Pulmonary effort is normal. No tachypnea or respiratory distress.     Breath sounds: Normal breath sounds. No decreased breath sounds, wheezing, rhonchi or rales.  Chest:     Chest wall: No tenderness.  Abdominal:     General: Bowel sounds are normal.     Palpations: Abdomen is soft.  Musculoskeletal:        General: Normal range of motion.     Cervical back: Normal range of motion.  Skin:    General: Skin is warm and dry.  Neurological:     Mental Status: He is alert and oriented to person, place, and time.     Coordination: Coordination normal.  Psychiatric:        Behavior: Behavior normal. Behavior is cooperative.        Thought Content: Thought content normal.        Judgment: Judgment normal.         Patient has been counseled extensively about nutrition and exercise as well as the importance of adherence with medications and regular follow-up. The patient was given clear instructions to go to ER or  return to medical center if symptoms don't improve, worsen or new problems develop. The patient verbalized understanding.   Follow-up: Return in about 3 months (around 06/01/2022).   [name], FNP-BC [facility] [place], [place]   03/01/2022, 3:34 PM

## 2022-03-02 ENCOUNTER — Other Ambulatory Visit: Payer: Self-pay

## 2022-03-02 ENCOUNTER — Other Ambulatory Visit: Payer: Self-pay | Admitting: Nurse Practitioner

## 2022-03-02 DIAGNOSIS — I420 Dilated cardiomyopathy: Secondary | ICD-10-CM

## 2022-03-02 DIAGNOSIS — I10 Essential (primary) hypertension: Secondary | ICD-10-CM

## 2022-03-02 DIAGNOSIS — I7789 Other specified disorders of arteries and arterioles: Secondary | ICD-10-CM

## 2022-03-02 DIAGNOSIS — E785 Hyperlipidemia, unspecified: Secondary | ICD-10-CM

## 2022-03-02 DIAGNOSIS — I426 Alcoholic cardiomyopathy: Secondary | ICD-10-CM

## 2022-03-02 LAB — CMP14+EGFR
ALT: 27 IU/L (ref 0–44)
AST: 26 IU/L (ref 0–40)
Albumin/Globulin Ratio: 1.6 (ref 1.2–2.2)
Albumin: 4.3 g/dL (ref 3.8–4.8)
Alkaline Phosphatase: 75 IU/L (ref 44–121)
BUN/Creatinine Ratio: 14 (ref 10–24)
BUN: 14 mg/dL (ref 8–27)
Bilirubin Total: 0.7 mg/dL (ref 0.0–1.2)
CO2: 24 mmol/L (ref 20–29)
Calcium: 9.4 mg/dL (ref 8.6–10.2)
Chloride: 102 mmol/L (ref 96–106)
Creatinine, Ser: 0.97 mg/dL (ref 0.76–1.27)
Globulin, Total: 2.7 g/dL (ref 1.5–4.5)
Glucose: 135 mg/dL — ABNORMAL HIGH (ref 70–99)
Potassium: 4.5 mmol/L (ref 3.5–5.2)
Sodium: 138 mmol/L (ref 134–144)
Total Protein: 7 g/dL (ref 6.0–8.5)
eGFR: 88 mL/min/{1.73_m2} (ref >59)

## 2022-03-02 LAB — LIPID PANEL
Chol/HDL Ratio: 3.7 ratio (ref 0.0–5.0)
Cholesterol, Total: 177 mg/dL (ref 100–199)
HDL: 48 mg/dL (ref >39)
LDL Chol Calc (NIH): 114 mg/dL — ABNORMAL HIGH (ref 0–99)
Triglycerides: 82 mg/dL (ref 0–149)
VLDL Cholesterol Cal: 15 mg/dL (ref 5–40)

## 2022-03-02 MED ORDER — LOSARTAN POTASSIUM 50 MG PO TABS
50.0000 mg | ORAL_TABLET | Freq: Every day | ORAL | 2 refills | Status: DC
  Filled 2022-03-02: qty 30, 30d supply, fill #0
  Filled 2022-05-31: qty 30, 30d supply, fill #1

## 2022-03-02 MED ORDER — ATORVASTATIN CALCIUM 40 MG PO TABS
40.0000 mg | ORAL_TABLET | Freq: Every day | ORAL | 3 refills | Status: DC
  Filled 2022-03-02: qty 90, 90d supply, fill #0
  Filled 2022-05-31: qty 90, 90d supply, fill #1

## 2022-03-03 ENCOUNTER — Other Ambulatory Visit: Payer: Self-pay

## 2022-03-27 ENCOUNTER — Telehealth (HOSPITAL_COMMUNITY): Payer: Self-pay | Admitting: *Deleted

## 2022-03-27 NOTE — Telephone Encounter (Signed)
Attempted to call patient regarding upcoming cardiac CT appointment. Patient did not pick up and was not able to leave a voicemail.  Larey Brick RN Navigator Cardiac Imaging [facility] [phone] Office [phone] Cell

## 2022-03-29 ENCOUNTER — Ambulatory Visit: Admission: RE | Admit: 2022-03-29 | Payer: Medicaid Other | Source: Ambulatory Visit

## 2022-04-02 NOTE — Progress Notes (Deleted)
Cardiology Clinic Note   Patient Name: Donald Salas Date of Encounter: 04/02/2022  Primary Care Provider:  [name], NP Primary Cardiologist:  [name], MD  Patient Profile    Donald Salas 61 year old male presents the clinic for follow-up evaluation of his dilated/alcoholic cardiomyopathy.  Past Medical History    Past Medical History:  Diagnosis Date   Alcohol addiction ([place])    Alcoholic cardiomyopathy ([place])    Cervical spondylosis with myelopathy    Closed left clavicular fracture when in middle school   Drug addiction ([place])    Weakness    Past Surgical History:  Procedure Laterality Date   LEG SURGERY     left chainsaw accident    Allergies  No Known Allergies  History of Present Illness    [name] has a PMH of dilated cardiomyopathy with an EF of 25-30% showing global hypokinesis.  He has no other past cardiac history.  His nuclear stress test showed no ischemia.  He was lost to follow-up.  He was seen back in the clinic fall 2021 and had a follow-up echocardiogram which showed an EF of 45-50%.  He was seen by [name] 10/19/2020.  He reported he had been drinking 1 beer per week.  He denied chest pain, neck discomfort, and arm discomfort.  He denied new shortness of breath, PND and orthopnea.  He denied palpitations, presyncope, and syncope.  He was noted to have 1 girdle weakness, back problems, and joint problems.  He presented to the clinic 12/08/20 for follow-up evaluation stated he had pain in his back and shoulders.  He also noticed pain in his right leg.  These were related to orthopedic issues that he  had for quite some time.  He reported that he had been out of his lisinopril and his metoprolol for around 3 days.  His heart rate was slightly elevated  at 107 and 98 on recheck.  He reported he was limited in his physical activity due to his back shoulder and leg pain.  His blood pressure was well controlled at 120/88.  He reported that  he ate a heart healthy low-sodium diet and did not add salt to his food.  I  asked him to increase his physical activity as tolerated, gave him the heart healthy low-sodium diet paperwork, and planned follow-up in 6 months.  He was seen in follow-up by [name] on 12/16/2021.  He had not been taking his medications.  He no longer had a PCP.  He was drinking 340 ounce beers per week.  He denied cardiac symptoms.  He noted shortness of breath with walking 40 yards on level ground.  He denied orthopnea and PND.  He was noted to have a slow and shuffling gait.  He was living by himself.  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  He was started on metoprolol XL 50 mg daily, echocardiogram was ordered and he was instructed to stop drinking.  His echocardiogram has not yet been completed.  His lab work from 12/16/2028 was unremarkable.  He presented to the clinic 01/27/22 for follow-up evaluation stated he felt well .  He was limited in his physical activity due to his right knee stiffness.  He reported that he had dietary discretion and was eating a regular diet.  Initially his blood pressure was 160/88 and on recheck was 136/84.  He reported compliance with his metoprolol . He reported that he did not receive a call or have his  echocardiogram scheduled.  I stressed the importance of follow-up echocardiogram and started him on losartan 25 mg .  He continued to drink about 40 ounces of beer daily.  I stressed the importance of cutting back and quitting.  He expressed understanding.  We will have him come back in 1 to 2 weeks for BMP and plan follow-up for 1 to 2 months after his echocardiogram.  Follow-up echocardiogram 02/10/2022 showed an LVEF of 40-45% with G1 DD.  He was noted to have swirling low flow state that has left ventricular apex.  Cardiac MRI was ordered to rule out apical thrombus.  He was a no-show for his cardiac MRI.  His follow-up lab work was unremarkable.  He presents to the clinic today for follow-up  evaluation states***  Today he denies chest pain, shortness of breath, lower extremity edema, fatigue, palpitations, melena, hematuria, hemoptysis, diaphoresis, weakness, presyncope, syncope, orthopnea, and PND.   Dilated/alcoholic cardiomyopathy-breathing unchanged.  Continued activity intolerance NYHA class III.  Follow-up echocardiogram showed an LVEF of 40-45% G1 DD and a swirling pattern in the LV apex.   Continue  metoprolol Heart healthy low-sodium diet-salty 6 reviewed  Increase physical activity as tolerated Increase losartan to 50 mg daily Repeat BMP in 1-2 weeks. Order cardiac MRI  Essential hypertension-BP today ***136/84.  Well-controlled at home. Continue  metoprolol Heart healthy low-sodium diet-salty 6 given Increase physical activity as tolerated Maintain blood pressure log  Aortic root dilation-follow-up echocardiogram 02/10/2022 showed ascending aortic aneurysm of 40 mm.  Previous echocardiogram showed mild dilation of the ascending aorta at 42 mm. Continue  metoprolol Maintain good blood pressure control Heart healthy low-sodium diet   Disposition: Follow-up with [name] or me 1-2 months  Home Medications    Prior to Admission medications   Medication Sig Start Date End Date Taking? Authorizing Provider  lisinopril (ZESTRIL) 10 MG tablet Take 1 tablet (10 mg total) by mouth daily. 10/19/20 01/17/21  [name], MD  metoprolol succinate (TOPROL XL) 50 MG 24 hr tablet Take 1 tablet (50 mg total) by mouth daily. 10/19/20   [name], MD    Family History    Family History  Problem Relation Age of Onset   Breast cancer Mother    Other Father        unknown history   Asthma Brother    Eczema Brother    He indicated that his mother is deceased. He indicated that his father is deceased. He indicated that his brother is alive.  Social History    Social History   Socioeconomic History   Marital status: Single    Spouse name: Not on file    Number of children: 5   Years of education: 12   Highest education level: High school graduate  Occupational History   Occupation: Unemployed  Tobacco Use   Smoking status: Every Day    Packs/day: 1.00    Years: 79.00    Total pack years: 79.00    Types: Cigarettes    Start date: 09/09/1969   Smokeless tobacco: Never  Vaping Use   Vaping Use: Never used  Substance and Sexual Activity   Alcohol use: Yes    Alcohol/week: 0.0 standard drinks of alcohol    Comment: 1-4 - 40oz malt liquor per day   Drug use: Yes    Types: Cocaine    Comment: Crack cocaine every day--smokes, Rare MJ.  Was in prison 4-5 years ago for 4 months, but started using after released.  No  treatment otherwise.     Sexual activity: Never  Other Topics Concern   Not on file  Social History Narrative   Born and raised in [place].   Graduated from [employer].   Was in [employer]	[place].     Started drugs with different women he was with throughout life.    Lives alone in a house.  Not clear how he supports his habits.    Right-handed.   No caffeine use.   Social Determinants of Health   Financial Resource Strain: Not on file  Food Insecurity: Not on file  Transportation Needs: Not on file  Physical Activity: Not on file  Stress: Not on file  Social Connections: Not on file  Intimate Partner Violence: Not on file     Review of Systems    General:  No chills, fever, night sweats or weight changes.  Cardiovascular:  No chest pain, dyspnea on exertion, edema, orthopnea, palpitations, paroxysmal nocturnal dyspnea. Dermatological: No rash, lesions/masses Respiratory: No cough, dyspnea Urologic: No hematuria, dysuria Abdominal:   No nausea, vomiting, diarrhea, bright red blood per rectum, melena, or hematemesis Neurologic:  No visual changes, wkns, changes in mental status. All other systems reviewed and are otherwise negative except as noted above.  Physical Exam    VS:  There were no vitals taken for this  visit. , BMI There is no height or weight on file to calculate BMI. GEN: Well nourished, well developed, in no acute distress. HEENT: normal. Neck: Supple, no JVD, carotid bruits, or masses. Cardiac: RRR, no murmurs, rubs, or gallops. No clubbing, cyanosis, edema.  Radials/DP/PT 2+ and equal bilaterally.  Respiratory:  Respirations regular and unlabored, clear to auscultation bilaterally. GI: Soft, nontender, nondistended, BS + x 4. MS: no deformity or atrophy. Skin: warm and dry, no rash. Neuro:  Strength and sensation are intact. Psych: Normal affect.  Accessory Clinical Findings    Recent Labs: 12/16/2021: Hemoglobin 16.1; Platelets 163; TSH 1.580 03/01/2022: ALT 27; BUN 14; Creatinine, Ser 0.97; Potassium 4.5; Sodium 138   Recent Lipid Panel    Component Value Date/Time   CHOL 177 03/01/2022 1526   TRIG 82 03/01/2022 1526   HDL 48 03/01/2022 1526   CHOLHDL 3.7 03/01/2022 1526   LDLCALC 114 (H) 03/01/2022 1526    ECG personally reviewed by me today-none today.  Echocardiogram 08/10/2020 IMPRESSIONS     1. Left ventricular ejection fraction, by estimation, is 45 to 50%. The  left ventricle has mildly decreased function. The left ventricle  demonstrates global hypokinesis. There is mild concentric left ventricular  hypertrophy. Left ventricular diastolic  parameters are consistent with Grade I diastolic dysfunction (impaired  relaxation).   2. Right ventricular systolic function is normal. The right ventricular  size is normal. Tricuspid regurgitation signal is inadequate for assessing  PA pressure.   3. The mitral valve is normal in structure. No evidence of mitral valve  regurgitation. No evidence of mitral stenosis.   4. The aortic valve is normal in structure. Aortic valve regurgitation is  not visualized. No aortic stenosis is present.   5. Aortic dilatation noted. There is mild dilatation of the ascending  aorta, measuring 42 mm.   6. The inferior vena cava is  normal in size with greater than 50%  respiratory variability, suggesting right atrial pressure of 3 mmHg.   Comparison(s): Prior images unable to be directly viewed, comparison made  by report only. The left ventricular function has improved.   Echocardiogram 02/10/2022 IMPRESSIONS  1. Swirling suggestive of low flow state and increased risk for thrombus  formation noted at LV apex; consider cardiac MRI to fully R/O apical  thrombus.   2. Left ventricular ejection fraction, by estimation, is 40 to 45%. The  left ventricle has mildly decreased function. The left ventricle  demonstrates global hypokinesis. There is severe concentric left  ventricular hypertrophy. Left ventricular diastolic   parameters are consistent with Grade I diastolic dysfunction (impaired  relaxation).   3. Right ventricular systolic function is normal. The right ventricular  size is normal.   4. The mitral valve is normal in structure. Trivial mitral valve  regurgitation. No evidence of mitral stenosis.   5. The aortic valve is tricuspid. Aortic valve regurgitation is not  visualized. No aortic stenosis is present.   6. Aortic dilatation noted. There is mild dilatation of the ascending  aorta, measuring 40 mm.   7. The inferior vena cava is normal in size with greater than 50%  respiratory variability, suggesting right atrial pressure of 3 mmHg.  Assessment & Plan   1. ***   Thomasene Ripple. Kennethia Lynes NP-C    04/02/2022, 1:36 PM [facility] [address] Office [phone] Fax [phone]  Notice: This dictation was prepared with Dragon dictation along with smaller phrase technology. Any transcriptional errors that result from this process are unintentional and may not be corrected upon review.  I spent  *** minutes examining this patient, reviewing medications, and using patient centered shared decision making involving her cardiac care.  Prior to her visit I spent  greater than 20 minutes reviewing her past medical history,  medications, and prior cardiac tests.

## 2022-04-03 ENCOUNTER — Ambulatory Visit: Payer: Medicaid Other | Admitting: General Practice

## 2022-04-07 ENCOUNTER — Encounter: Payer: Self-pay | Admitting: General Practice

## 2022-05-31 ENCOUNTER — Encounter: Payer: Self-pay | Admitting: Nurse Practitioner

## 2022-05-31 ENCOUNTER — Ambulatory Visit
Admission: RE | Admit: 2022-05-31 | Discharge: 2022-05-31 | Disposition: A | Discharge status: Home / Self Care | Payer: Medicaid Other | Source: Ambulatory Visit | Attending: Nurse Practitioner | Admitting: Nurse Practitioner

## 2022-05-31 ENCOUNTER — Ambulatory Visit: Payer: Medicaid Other | Attending: Nurse Practitioner | Admitting: Nurse Practitioner

## 2022-05-31 ENCOUNTER — Other Ambulatory Visit: Payer: Self-pay

## 2022-05-31 VITALS — BP 145/92 | HR 108 | Temp 98.1°F | Ht 70.0 in | Wt 190.6 lb

## 2022-05-31 DIAGNOSIS — E785 Hyperlipidemia, unspecified: Secondary | ICD-10-CM

## 2022-05-31 DIAGNOSIS — J449 Chronic obstructive pulmonary disease, unspecified: Secondary | ICD-10-CM

## 2022-05-31 DIAGNOSIS — R7303 Prediabetes: Secondary | ICD-10-CM | POA: Diagnosis not present

## 2022-05-31 DIAGNOSIS — M25561 Pain in right knee: Secondary | ICD-10-CM | POA: Diagnosis not present

## 2022-05-31 DIAGNOSIS — I10 Essential (primary) hypertension: Secondary | ICD-10-CM

## 2022-05-31 DIAGNOSIS — G8929 Other chronic pain: Secondary | ICD-10-CM

## 2022-05-31 DIAGNOSIS — Z23 Encounter for immunization: Secondary | ICD-10-CM | POA: Diagnosis not present

## 2022-05-31 MED ORDER — ATORVASTATIN CALCIUM 40 MG PO TABS
40.0000 mg | ORAL_TABLET | Freq: Every day | ORAL | 3 refills | Status: AC
  Filled 2022-10-13: qty 90, 90d supply, fill #0
  Filled 2022-12-21 (×2): qty 90, 90d supply, fill #1

## 2022-05-31 MED ORDER — MOMETASONE FURO-FORMOTEROL FUM 100-5 MCG/ACT IN AERO
2.0000 | INHALATION_SPRAY | Freq: Two times a day (BID) | RESPIRATORY_TRACT | 3 refills | Status: DC
  Filled 2022-05-31: qty 13, 30d supply, fill #0

## 2022-05-31 MED ORDER — PREDNISONE 20 MG PO TABS
40.0000 mg | ORAL_TABLET | Freq: Every day | ORAL | 0 refills | Status: AC
  Filled 2022-05-31 – 2022-12-21 (×3): qty 10, 5d supply, fill #0

## 2022-05-31 NOTE — Progress Notes (Signed)
Assessment & Plan:  Donald Salas was seen today for hypertension.  Diagnoses and all orders for this visit:  Essential hypertension -     CMP14+EGFR Continue all antihypertensives as prescribed.  Reminded to bring in blood pressure log for follow  up appointment.  RECOMMENDATIONS: DASH/Mediterranean Diets are healthier choices for HTN.    Prediabetes -     Hemoglobin A1c Continue blood sugar control as discussed in office today, low carbohydrate diet, and regular physical exercise as tolerated, 150 minutes per week (30 min each day, 5 days per week, or 50 min 3 days per week). Keep blood sugar logs with fasting goal of 90-130 mg/dl, post prandial (after you eat) less than 180.  For Hypoglycemia: BS <60 and Hyperglycemia BS >400; contact the clinic ASAP.   Chronic pain of right knee -     DG Knee Complete 4 Views Right; Future -     predniSONE (DELTASONE) 20 MG tablet; Take 2 tablets (40 mg total) by mouth daily with breakfast for 5 days. Work on losing weight to help reduce joint pain. May alternate with heat and ice application for pain relief. May also alternate with acetaminophen and Ibuprofen as prescribed pain relief. Other alternatives include massage, acupuncture and water aerobics.    COPD mixed type ([place]) -     mometasone-formoterol (DULERA) 100-5 MCG/ACT AERO; Inhale 2 puffs into the lungs 2 (two) times daily.  Dyslipidemia, goal LDL below 100 -     atorvastatin (LIPITOR) 40 MG tablet; Take 1 tablet (40 mg total) by mouth daily.  Need for immunization against influenza -     Flu Vaccine QUAD 45moIM (Fluarix, Fluzone & Alfiuria Quad PF)  Need for shingles vaccine -     Varicella-zoster vaccine IM    Patient has been counseled on age-appropriate routine health concerns for screening and prevention. These are reviewed and up-to-date. Referrals have been placed accordingly. Immunizations are up-to-date or declined.    Subjective:   Chief Complaint  Patient presents with    Hypertension   HPI Donald KORAL89y.o. male presents to office today for follow up to HTN  He has a past medical history of Alcohol addiction, Alcoholic cardiomyopathy, Cervical spondylosis with myelopathy, Closed left clavicular fracture (when in middle school), Drug addiction (cocaine), ETOH overuse.  Currently followed by Cardiology for his history of CM/CHF EF 25-30%.  Recently started on losartan 25 mg daily on 01/27/2022 by cardiology.  He is also currently taking metoprolol XL 50 mg daily.  Cardiac MRI pending due to possible apical thrombus.   HTN Blood pressure is slightly elevated today.  He endorses adherence taking losartan 50 mg daily and Toprol-XL 50 mg daily however several prescriptions have not been picked up from the pharmacy in a few months.  BP Readings from Last 3 Encounters:  05/31/22 (!) 145/92  03/01/22 (!) 143/83  01/27/22 136/84   Prediabetes Well controlled with diet only at this time. LDL not at goal. It does not appear he has picked up his atorvastatin recently  Lab Results  Component Value Date   HGBA1C 6.0 03/01/2022    Lab Results  Component Value Date   LDLCALC 114 (H) 03/01/2022    COPD Smoker for over 50 years. Used to smoke 2 ppd and now 1/2 ppd. Notes intermittent shortness of breath with exertion. Has chronic intermittent productive cough.  He has currently not smoked in 5 days as he has not been able to smoke.   Knee Pain Patient  presents with knee pain involving the  right knee. Onset of the symptoms was several months ago. Inciting event: none known. Current symptoms include giving out, pain located in the patellar region, stiffness, and swelling. Pain is aggravated by any weight bearing, going up and down stairs, kneeling, pivoting, rising after sitting, squatting, standing, and walking.  Patient has had no prior knee problems. Evaluation to date: none. Treatment to date: none.   Review of Systems  Constitutional:  Negative for fever,  malaise/fatigue and weight loss.  HENT: Negative.  Negative for nosebleeds.   Eyes: Negative.  Negative for blurred vision, double vision and photophobia.  Respiratory: Negative.  Negative for cough and shortness of breath.   Cardiovascular: Negative.  Negative for chest pain, palpitations and leg swelling.  Gastrointestinal: Negative.  Negative for heartburn, nausea and vomiting.  Musculoskeletal:  Positive for joint pain. Negative for myalgias.  Neurological: Negative.  Negative for dizziness, focal weakness, seizures and headaches.  Psychiatric/Behavioral: Negative.  Negative for suicidal ideas.     Past Medical History:  Diagnosis Date   Alcohol addiction ([place])    Alcoholic cardiomyopathy ([place])    Cervical spondylosis with myelopathy    Closed left clavicular fracture when in middle school   Drug addiction ([place])    Weakness     Past Surgical History:  Procedure Laterality Date   LEG SURGERY     left chainsaw accident    Family History  Problem Relation Age of Onset   Breast cancer Mother    Other Father        unknown history   Asthma Brother    Eczema Brother     Social History Reviewed with no changes to be made today.   Outpatient Medications Prior to Visit  Medication Sig Dispense Refill   albuterol (VENTOLIN HFA) 108 (90 Base) MCG/ACT inhaler Inhale 2 puffs into the lungs every 6 (six) hours as needed for wheezing or shortness of breath. 18 g 2   losartan (COZAAR) 50 MG tablet Take 1 tablet (50 mg total) by mouth daily. DOSE CHANGE! 30 tablet 2   metoprolol succinate (TOPROL-XL) 50 MG 24 hr tablet Take 1 tablet (50 mg total) by mouth daily. Take with or immediately following a meal. 90 tablet 3   traZODone (DESYREL) 50 MG tablet Take 1 tablet (50 mg total) by mouth at bedtime as needed for sleep. 30 tablet 3   atorvastatin (LIPITOR) 40 MG tablet Take 1 tablet (40 mg total) by mouth daily. 90 tablet 3   mometasone-formoterol (DULERA) 100-5 MCG/ACT AERO Inhale 2  puffs into the lungs 2 (two) times daily. 13 g 3   No facility-administered medications prior to visit.    No Known Allergies     Objective:    BP (!) 145/92   Pulse (!) 108   Temp 98.1 F (36.7 C) (Oral)   Ht '5\' 10"'  (1.778 m)   Wt 190 lb 9.6 oz (86.5 kg)   BMI 27.35 kg/m  Wt Readings from Last 3 Encounters:  05/31/22 190 lb 9.6 oz (86.5 kg)  03/01/22 183 lb 12.8 oz (83.4 kg)  01/27/22 183 lb 6.4 oz (83.2 kg)    Physical Exam Vitals and nursing note reviewed.  Constitutional:      Appearance: He is well-developed.  HENT:     Head: Normocephalic and atraumatic.  Cardiovascular:     Rate and Rhythm: Normal rate and regular rhythm.     Heart sounds: Normal heart sounds. No murmur heard.  No friction rub. No gallop.  Pulmonary:     Effort: Pulmonary effort is normal. No tachypnea or respiratory distress.     Breath sounds: Wheezing present. No decreased breath sounds, rhonchi or rales.  Chest:     Chest wall: No tenderness.  Abdominal:     General: Bowel sounds are normal.     Palpations: Abdomen is soft.  Musculoskeletal:        General: Normal range of motion.     Cervical back: Normal range of motion.  Skin:    General: Skin is warm and dry.  Neurological:     Mental Status: He is alert and oriented to person, place, and time.     Coordination: Coordination normal.  Psychiatric:        Behavior: Behavior normal. Behavior is cooperative.        Thought Content: Thought content normal.        Judgment: Judgment normal.          Patient has been counseled extensively about nutrition and exercise as well as the importance of adherence with medications and regular follow-up. The patient was given clear instructions to go to ER or return to medical center if symptoms don't improve, worsen or new problems develop. The patient verbalized understanding.   Follow-up: Return in about 3 months (around 08/30/2022).   [name], FNP-BC [facility] and [place], [place]   05/31/2022, 8:30 PM

## 2022-06-01 ENCOUNTER — Other Ambulatory Visit: Payer: Self-pay | Admitting: Nurse Practitioner

## 2022-06-01 DIAGNOSIS — G8929 Other chronic pain: Secondary | ICD-10-CM

## 2022-06-01 LAB — CMP14+EGFR
ALT: 27 IU/L (ref 0–44)
AST: 24 IU/L (ref 0–40)
Albumin/Globulin Ratio: 1.5 (ref 1.2–2.2)
Albumin: 4.3 g/dL (ref 3.9–4.9)
Alkaline Phosphatase: 77 IU/L (ref 44–121)
BUN/Creatinine Ratio: 13 (ref 10–24)
BUN: 12 mg/dL (ref 8–27)
Bilirubin Total: 0.7 mg/dL (ref 0.0–1.2)
CO2: 25 mmol/L (ref 20–29)
Calcium: 9.4 mg/dL (ref 8.6–10.2)
Chloride: 103 mmol/L (ref 96–106)
Creatinine, Ser: 0.89 mg/dL (ref 0.76–1.27)
Globulin, Total: 2.8 g/dL (ref 1.5–4.5)
Glucose: 116 mg/dL — ABNORMAL HIGH (ref 70–99)
Potassium: 4.2 mmol/L (ref 3.5–5.2)
Sodium: 142 mmol/L (ref 134–144)
Total Protein: 7.1 g/dL (ref 6.0–8.5)
eGFR: 97 mL/min/{1.73_m2} (ref >59)

## 2022-06-01 LAB — HEMOGLOBIN A1C
Est. average glucose Bld gHb Est-mCnc: 131 mg/dL
Hgb A1c MFr Bld: 6.2 % — ABNORMAL HIGH (ref 4.8–5.6)

## 2022-06-07 ENCOUNTER — Other Ambulatory Visit: Payer: Self-pay

## 2022-06-08 ENCOUNTER — Ambulatory Visit: Payer: Medicaid Other | Admitting: Sports Medicine

## 2022-06-08 ENCOUNTER — Encounter: Payer: Self-pay | Admitting: Sports Medicine

## 2022-06-08 VITALS — BP 143/89 | HR 86 | Ht 70.0 in | Wt 190.0 lb

## 2022-06-08 DIAGNOSIS — M25561 Pain in right knee: Secondary | ICD-10-CM

## 2022-06-08 DIAGNOSIS — G8929 Other chronic pain: Secondary | ICD-10-CM

## 2022-06-08 DIAGNOSIS — M25562 Pain in left knee: Secondary | ICD-10-CM | POA: Diagnosis not present

## 2022-06-08 DIAGNOSIS — M1711 Unilateral primary osteoarthritis, right knee: Secondary | ICD-10-CM | POA: Diagnosis not present

## 2022-06-08 DIAGNOSIS — M25461 Effusion, right knee: Secondary | ICD-10-CM | POA: Diagnosis not present

## 2022-06-08 NOTE — Progress Notes (Signed)
Donald Salas - 61 y.o. male MRN [id number]  Date of birth: 12-Feb-1961  Office Visit Note: Visit Date: 06/08/2022 PCP: Claiborne Rigg, NP Referred by: Claiborne Rigg, NP  Subjective: Chief Complaint  Patient presents with   Right Knee - Pain   HPI: Donald Salas is a pleasant 61 y.o. male who presents today for bilateral knee pain with acute on chronic right knee pain.  Patient states both of his knees bother him occasionally over the years, however his right knee has always been worse.  He has had increased pain and stiffness over the last few months.  However more recently his pain has been exacerbated and he feels like the right knee is giving out on him.  He has tried over-the-counter anti-inflammatories at times, but nothing recently.  He sought evaluation by his PCP in the past, but denies any prescription or injection therapy.  Does report stiffness as well as swelling in the right knee.  No redness, no fever or chills.  Pertinent ROS were reviewed with the patient and found to be negative unless otherwise specified above in HPI.   Assessment & Plan: Visit Diagnoses:  1. Chronic pain of both knees   2. Unilateral primary osteoarthritis, right knee   3. Effusion, right knee   4. Right knee pain, unspecified chronicity    Plan: I had a discussion with [name] today regarding the etiology of his right knee pain.  It does suggest that based on x-ray he has somewhat advanced OA of the right knee.  I am guessing he has likely arthritis of both knees given his exam today.  We may in the future consider bilateral AP standing and Zoila Shutter views to better quantify the degree of arthritis with gravity upon standing views.  Given the mild effusion of the right knee and the recent exacerbation with pain that is limiting his ability to walk and get around, through shared decision making elected to proceed with injection therapy.  Patient tolerated well.  Patient does report a history of  stroke states he is on blood thinners, I cannot see this on his chart, but given this we will stay away from NSAID therapy at the time until further chart review.  I did give him a home exercise set for knee osteoarthritis which she is to perform daily to work on range of motion and strengthening.  He will follow-up with me in about 6 weeks for reevaluation.  Follow-up: Return in about 6 weeks (around 07/20/2022).   Meds & Orders: No orders of the defined types were placed in this encounter.  No orders of the defined types were placed in this encounter.    Procedures: Knee Injection, Right: After discussion on risks/benefits/indications, informed verbal consent was obtained and a timeout was performed, patient was seated on exam table. The patient's knee was prepped with Betadine and alcohol swab and utilizing anteromedial approach, the patient's knee was injected intraarticularly with 2:2:2 lidocaine 1%:bupivicaine 0.25%:depomedrol. Patient tolerated the procedure well without immediate complications.       Clinical History: No specialty comments available.  He reports that he has been smoking cigarettes. He started smoking about 52 years ago. He has a 79.00 pack-year smoking history. He has never used smokeless tobacco.  Recent Labs    03/01/22 1529 05/31/22 1520  HGBA1C 6.0 6.2*    Objective:   Vital Signs: BP (!) 143/89   Pulse 86   Ht 5\' 10"  (1.778 m)   Wt 190  lb (86.2 kg)   BMI 27.26 kg/m   Physical Exam  Gen: Well-appearing, in no acute distress; non-toxic CV: Regular Rate. Well-perfused. Warm.  Resp: Breathing unlabored on room air; no wheezing. Psych: Fluid speech in conversation; appropriate affect; normal thought process Neuro: Sensation intact throughout. No gross coordination deficits.   Ortho Exam -Bilateral knees: There is a superficial open scab on the left knee.  The right knee has a mild effusion appreciated without erythema.  There is limited range of motion  of the right knee from about 5-120 degrees, compared to 0-130 degrees of the contralateral left knee.  No varus or valgus instability.  There is some medial joint space TTP over the right knee.  Ligamentously intact.  There is some weakness with quad firing bilaterally, VMO mild atrophy.  Imaging:  *Independent review of 4 views x-ray of the right knee on 06/01/2022 were interpreted by myself.  There is at least moderate medial and patellofemoral compartment narrowing and osteoarthritis.  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  There is degenerative change of the medial and lateral joint line.  Unsure if these are standing x-rays.  DG Knee Complete 4 Views Right CLINICAL DATA:  Chronic right knee pain.  EXAM: RIGHT KNEE - COMPLETE 4+ VIEW  COMPARISON:  None Available.  FINDINGS: Moderate medial coronoid joint space narrowing and peripheral osteophytosis. Mild peripheral lateral compartment degenerative osteophytosis. Mild-to-moderate patellofemoral joint space narrowing and peripheral osteophytosis.  Tiny joint effusion.  There is well corticated ossicle measuring up to approximately 2.4 cm at the superior aspect of the tibial tubercle, possibly the sequela of remote Osgood-Schlatter disease.  IMPRESSION: 1. Mild-to-moderate medial and patellofemoral compartment osteoarthritis. 2. 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  Electronically Signed   By: Neita Garnet M.D.   On: 06/01/2022 13:02    Past Medical/Family/Surgical/Social History: Medications & Allergies reviewed per EMR, new medications updated. Patient Active Problem List   Diagnosis Date Noted   Aortic root enlargement ([facility]) 10/16/2020   Cardiomyopathy ([facility]) 04/03/2018   Cervical myelopathy ([facility]) 02/13/2018   Cerebral vascular disease  02/13/2018   Gait abnormality 01/03/2018   Neck pain 01/03/2018   Left arm weakness 12/30/2015   Alcohol abuse 12/30/2015   Drug abuse ([facility]) 12/30/2015   Past Medical History:  Diagnosis Date   Alcohol addiction ([facility])    Alcoholic cardiomyopathy ([facility])    Cervical spondylosis with myelopathy    Closed left clavicular fracture when in middle school   Drug addiction ([facility])    Weakness    Family History  Problem Relation Age of Onset   Breast cancer Mother    Other Father        unknown history   Asthma Brother    Eczema Brother    Past Surgical History:  Procedure Laterality Date   LEG SURGERY     left chainsaw accident   Social History   Occupational History   Occupation: Unemployed  Tobacco Use   Smoking status: Every Day    Packs/day: 1.00    Years: 79.00    Total pack years: 79.00    Types: Cigarettes    Start date: 09/09/1969   Smokeless tobacco: Never  Vaping Use   Vaping Use: Never used  Substance and Sexual Activity   Alcohol use: Yes    Alcohol/week: 0.0 standard drinks of alcohol  Comment: 1-4 - 40oz malt liquor per day   Drug use: Yes    Types: Cocaine    Comment: Crack cocaine every day--smokes, Rare MJ.  Was in prison 4-5 years ago for 4 months, but started using after released.  No treatment otherwise.     Sexual activity: Never

## 2022-06-08 NOTE — Progress Notes (Signed)
Pain and stiffness for years.Cant bend his knee due to stiffness. Giving out on him. No treatment yet. Tried ibu but didn't help.   Not diabetic

## 2022-06-22 ENCOUNTER — Ambulatory Visit
Admission: RE | Admit: 2022-06-22 | Discharge: 2022-06-22 | Disposition: A | Discharge status: Home / Self Care | Payer: Medicaid Other | Source: Ambulatory Visit | Attending: Nurse Practitioner | Admitting: Nurse Practitioner

## 2022-06-22 DIAGNOSIS — Z720 Tobacco use: Secondary | ICD-10-CM

## 2022-06-26 ENCOUNTER — Telehealth: Payer: Self-pay | Admitting: *Deleted

## 2022-06-26 NOTE — Telephone Encounter (Signed)
Patient called for CT results and informed:  Donald Pounds, NP  06/25/2022 10:30 AM EDT     Needs to follow up with Cardiology. He no showed in July. CT scan shows mild emphysema or COPD. Needs to stop smoking. Also showed enlarged thoracic aorta which is a large blood vessel coming from the heart. This can get larger the more he smokes and puts him at risk for the vessel to get too large and rupture. Again pls stop smoking  Patient advised strongly to schedule appointment with cardiology for follow up and stop smoking. Patient states he has not smoked in 5 days- due to finances- advised he should continue- he will breath better and feel better.

## 2022-08-30 ENCOUNTER — Ambulatory Visit: Payer: Medicaid Other | Attending: Nurse Practitioner | Admitting: Nurse Practitioner

## 2022-08-30 ENCOUNTER — Encounter: Payer: Self-pay | Admitting: Nurse Practitioner

## 2022-08-30 ENCOUNTER — Other Ambulatory Visit: Payer: Self-pay

## 2022-08-30 VITALS — BP 123/86 | HR 121 | Ht 70.0 in | Wt 198.6 lb

## 2022-08-30 DIAGNOSIS — I10 Essential (primary) hypertension: Secondary | ICD-10-CM | POA: Diagnosis not present

## 2022-08-30 DIAGNOSIS — G8929 Other chronic pain: Secondary | ICD-10-CM

## 2022-08-30 DIAGNOSIS — M25561 Pain in right knee: Secondary | ICD-10-CM

## 2022-08-30 DIAGNOSIS — I426 Alcoholic cardiomyopathy: Secondary | ICD-10-CM | POA: Diagnosis not present

## 2022-08-30 DIAGNOSIS — R7303 Prediabetes: Secondary | ICD-10-CM

## 2022-08-30 MED ORDER — MELOXICAM 7.5 MG PO TABS
7.5000 mg | ORAL_TABLET | Freq: Every day | ORAL | 1 refills | Status: AC
  Filled 2022-08-30: qty 30, 30d supply, fill #0
  Filled 2022-12-21 (×2): qty 30, 30d supply, fill #1

## 2022-08-30 MED ORDER — METOPROLOL SUCCINATE ER 50 MG PO TB24
50.0000 mg | ORAL_TABLET | Freq: Every day | ORAL | 3 refills | Status: AC
  Filled 2022-08-30: qty 90, 90d supply, fill #0
  Filled 2022-12-21 (×2): qty 90, 90d supply, fill #1

## 2022-08-30 MED ORDER — LOSARTAN POTASSIUM 50 MG PO TABS
50.0000 mg | ORAL_TABLET | Freq: Every day | ORAL | 2 refills | Status: DC
  Filled 2022-08-30: qty 90, 90d supply, fill #0

## 2022-08-30 NOTE — Progress Notes (Signed)
No concerns. 

## 2022-08-30 NOTE — Progress Notes (Signed)
Assessment & Plan:  Donald Salas was seen today for hypertension.  Diagnoses and all orders for this visit:  Essential hypertension -     losartan (COZAAR) 50 MG tablet; Take 1 tablet (50 mg total) by mouth daily. DOSE CHANGE! -     metoprolol succinate (TOPROL-XL) 50 MG 24 hr tablet; Take 1 tablet (50 mg total) by mouth daily. Take with or immediately following a meal. -     CMP14+EGFR Continue all antihypertensives as prescribed.  Reminded to bring in blood pressure log for follow  up appointment.  RECOMMENDATIONS: DASH/Mediterranean Diets are healthier choices for HTN.    Prediabetes -     Hemoglobin A1c Continue blood sugar control as discussed in office today, low carbohydrate diet, and regular physical exercise as tolerated, 150 minutes per week (30 min each day, 5 days per week, or 50 min 3 days per week). Keep blood sugar logs with fasting goal of 90-130 mg/dl, post prandial (after you eat) less than 180.  For Hypoglycemia: BS <60 and Hyperglycemia BS >400; contact the clinic ASAP. Annual eye exams and foot exams are recommended.   Cardiomyopathy, alcoholic ([place]) -     metoprolol succinate (TOPROL-XL) 50 MG 24 hr tablet; Take 1 tablet (50 mg total) by mouth daily. Take with or immediately following a meal.  Chronic pain of right knee -     meloxicam (MOBIC) 7.5 MG tablet; Take 1 tablet (7.5 mg total) by mouth daily. For knee pain> take with food Work on losing weight to help reduce joint pain. May alternate with heat and ice application for pain relief. May also alternate with acetaminophen and Ibuprofen as prescribed pain relief. Other alternatives include massage, acupuncture and water aerobics.  You must stay active and avoid a sedentary lifestyle.     Patient has been counseled on age-appropriate routine health concerns for screening and prevention. These are reviewed and up-to-date. Referrals have been placed accordingly. Immunizations are up-to-date or declined.     Subjective:   Chief Complaint  Patient presents with   Hypertension   HPI Donald Salas 61 y.o. male presents to office today for follow up to HTN.  He has a past medical history of Alcohol addiction, Alcoholic cardiomyopathy, Cervical spondylosis with myelopathy, Closed left clavicular fracture (when in middle school), Drug addiction (cocaine), ETOH overuse.   Currently followed by Cardiology for his history of CM/CHF EF 25-30%.  Recently started on losartan 25 mg daily on 01/27/2022 by cardiology.  He is also currently taking metoprolol XL 50 mg daily.  Cardiac MRI pending due to possible apical thrombus.  HTN Blood pressure is well controlled today.  He endorses adherence taking losartan 50 mg daily and Toprol-XL 50 mg daily BP Readings from Last 3 Encounters:  08/30/22 123/86  06/08/22 (!) 143/89  05/31/22 (!) 145/92     Prediabetes Well controlled with diet only at this time. LDL not at goal. It does not appear he ever picked up his atorvastatin  Lab Results  Component Value Date   HGBA1C 6.2 (H) 08/30/2022   Lab Results  Component Value Date   LDLCALC 114 (H) 03/01/2022      Chronic knee pain He has chronic bilateral knee pain over the past several years. R>L. Notes sensation of right knee giving out at times. Associated symptoms include: stiffness and swelling. He was previously seeing ortho for this and noted for advanced OA of the right knee. Has received injection in the past as well.  Review of  Systems  Constitutional:  Negative for fever, malaise/fatigue and weight loss.  HENT: Negative.  Negative for nosebleeds.   Eyes: Negative.  Negative for blurred vision, double vision and photophobia.  Respiratory: Negative.  Negative for cough and shortness of breath.   Cardiovascular: Negative.  Negative for chest pain, palpitations and leg swelling.  Gastrointestinal: Negative.  Negative for heartburn, nausea and vomiting.  Musculoskeletal:  Positive for joint pain.  Negative for myalgias.  Neurological: Negative.  Negative for dizziness, focal weakness, seizures and headaches.  Psychiatric/Behavioral: Negative.  Negative for suicidal ideas.     Past Medical History:  Diagnosis Date   Alcohol addiction ([place])    Alcoholic cardiomyopathy ([place])    Cervical spondylosis with myelopathy    Closed left clavicular fracture when in middle school   Drug addiction ([place])    Weakness     Past Surgical History:  Procedure Laterality Date   LEG SURGERY     left chainsaw accident    Family History  Problem Relation Age of Onset   Breast cancer Mother    Other Father        unknown history   Asthma Brother    Eczema Brother     Social History Reviewed with no changes to be made today.   Outpatient Medications Prior to Visit  Medication Sig Dispense Refill   albuterol (VENTOLIN HFA) 108 (90 Base) MCG/ACT inhaler Inhale 2 puffs into the lungs every 6 (six) hours as needed for wheezing or shortness of breath. 18 g 2   atorvastatin (LIPITOR) 40 MG tablet Take 1 tablet (40 mg total) by mouth daily. 90 tablet 3   mometasone-formoterol (DULERA) 100-5 MCG/ACT AERO Inhale 2 puffs into the lungs 2 (two) times daily. 13 g 3   traZODone (DESYREL) 50 MG tablet Take 1 tablet (50 mg total) by mouth at bedtime as needed for sleep. 30 tablet 3   losartan (COZAAR) 50 MG tablet Take 1 tablet (50 mg total) by mouth daily. DOSE CHANGE! (Patient not taking: Reported on 08/30/2022) 30 tablet 2   metoprolol succinate (TOPROL-XL) 50 MG 24 hr tablet Take 1 tablet (50 mg total) by mouth daily. Take with or immediately following a meal. (Patient not taking: Reported on 08/30/2022) 90 tablet 3   No facility-administered medications prior to visit.    No Known Allergies     Objective:    BP 123/86   Pulse (!) 121   Ht _0  (1.778 m)   Wt 198 lb 9.6 oz (90.1 kg)   SpO2 92%   BMI 28.50 kg/m  Wt Readings from Last 3 Encounters:  08/30/22 198 lb 9.6 oz (90.1 kg)  06/08/22  190 lb (86.2 kg)  05/31/22 190 lb 9.6 oz (86.5 kg)    Physical Exam Vitals and nursing note reviewed.  Constitutional:      Appearance: He is well-developed.  HENT:     Head: Normocephalic and atraumatic.  Cardiovascular:     Rate and Rhythm: Regular rhythm. Tachycardia present.     Heart sounds: Normal heart sounds. No murmur heard.    No friction rub. No gallop.  Pulmonary:     Effort: Pulmonary effort is normal. No tachypnea or respiratory distress.     Breath sounds: Normal breath sounds. No decreased breath sounds, wheezing, rhonchi or rales.  Chest:     Chest wall: No tenderness.  Abdominal:     General: Bowel sounds are normal.     Palpations: Abdomen is soft.  Musculoskeletal:  General: Normal range of motion.     Cervical back: Normal range of motion.  Skin:    General: Skin is warm and dry.  Neurological:     Mental Status: He is alert and oriented to person, place, and time.     Coordination: Coordination normal.  Psychiatric:        Behavior: Behavior normal. Behavior is cooperative.        Thought Content: Thought content normal.        Judgment: Judgment normal.          Patient has been counseled extensively about nutrition and exercise as well as the importance of adherence with medications and regular follow-up. The patient was given clear instructions to go to ER or return to medical center if symptoms don't improve, worsen or new problems develop. The patient verbalized understanding.   Follow-up: Return in about 3 months (around 11/29/2022) for HTN.   [name], FNP-BC [facility] and [place], [place]   09/06/2022, 8:50 PM

## 2022-08-31 ENCOUNTER — Other Ambulatory Visit: Payer: Self-pay

## 2022-08-31 LAB — HEMOGLOBIN A1C
Est. average glucose Bld gHb Est-mCnc: 131 mg/dL
Hgb A1c MFr Bld: 6.2 % — ABNORMAL HIGH (ref 4.8–5.6)

## 2022-08-31 LAB — CMP14+EGFR
ALT: 32 IU/L (ref 0–44)
AST: 26 IU/L (ref 0–40)
Albumin/Globulin Ratio: 2 (ref 1.2–2.2)
Albumin: 4.6 g/dL (ref 3.9–4.9)
Alkaline Phosphatase: 77 IU/L (ref 44–121)
BUN/Creatinine Ratio: 16 (ref 10–24)
BUN: 13 mg/dL (ref 8–27)
Bilirubin Total: 0.6 mg/dL (ref 0.0–1.2)
CO2: 26 mmol/L (ref 20–29)
Calcium: 10 mg/dL (ref 8.6–10.2)
Chloride: 101 mmol/L (ref 96–106)
Creatinine, Ser: 0.81 mg/dL (ref 0.76–1.27)
Globulin, Total: 2.3 g/dL (ref 1.5–4.5)
Glucose: 108 mg/dL — ABNORMAL HIGH (ref 70–99)
Potassium: 4.2 mmol/L (ref 3.5–5.2)
Sodium: 140 mmol/L (ref 134–144)
Total Protein: 6.9 g/dL (ref 6.0–8.5)
eGFR: 100 mL/min/{1.73_m2} (ref >59)

## 2022-09-04 ENCOUNTER — Other Ambulatory Visit (HOSPITAL_COMMUNITY): Payer: Self-pay

## 2022-09-06 ENCOUNTER — Encounter: Payer: Self-pay | Admitting: Nurse Practitioner

## 2022-09-11 ENCOUNTER — Telehealth (HOSPITAL_COMMUNITY): Payer: Self-pay | Admitting: Emergency Medicine

## 2022-09-11 NOTE — Telephone Encounter (Signed)
Unable to leave vm Pascuala Klutts RN Navigator Cardiac Imaging [facility] [phone] Office  [phone] Cell  

## 2022-09-13 ENCOUNTER — Ambulatory Visit (HOSPITAL_COMMUNITY)
Admission: RE | Admit: 2022-09-13 | Discharge: 2022-09-13 | Disposition: A | Discharge status: Home / Self Care | Payer: Medicaid Other | Source: Ambulatory Visit | Attending: General Practice | Admitting: General Practice

## 2022-09-13 ENCOUNTER — Other Ambulatory Visit: Payer: Self-pay | Admitting: General Practice

## 2022-09-13 DIAGNOSIS — I513 Intracardiac thrombosis, not elsewhere classified: Secondary | ICD-10-CM

## 2022-09-13 MED ORDER — GADOBUTROL 1 MMOL/ML IV SOLN
12.0000 mL | Freq: Once | INTRAVENOUS | Status: AC | PRN
  Administered 2022-09-13: 12 mL via INTRAVENOUS

## 2022-10-13 ENCOUNTER — Other Ambulatory Visit: Payer: Self-pay

## 2022-11-29 ENCOUNTER — Other Ambulatory Visit: Payer: Self-pay

## 2022-11-29 ENCOUNTER — Encounter: Payer: Self-pay | Admitting: Nurse Practitioner

## 2022-11-29 ENCOUNTER — Ambulatory Visit: Payer: Medicaid Other | Attending: Nurse Practitioner | Admitting: Nurse Practitioner

## 2022-11-29 VITALS — BP 137/86 | HR 85 | Ht 70.0 in | Wt 194.6 lb

## 2022-11-29 DIAGNOSIS — R7303 Prediabetes: Secondary | ICD-10-CM

## 2022-11-29 DIAGNOSIS — I426 Alcoholic cardiomyopathy: Secondary | ICD-10-CM

## 2022-11-29 DIAGNOSIS — I10 Essential (primary) hypertension: Secondary | ICD-10-CM | POA: Diagnosis not present

## 2022-11-29 DIAGNOSIS — E785 Hyperlipidemia, unspecified: Secondary | ICD-10-CM

## 2022-11-29 DIAGNOSIS — J449 Chronic obstructive pulmonary disease, unspecified: Secondary | ICD-10-CM

## 2022-11-29 DIAGNOSIS — F5101 Primary insomnia: Secondary | ICD-10-CM | POA: Diagnosis not present

## 2022-11-29 DIAGNOSIS — D649 Anemia, unspecified: Secondary | ICD-10-CM

## 2022-11-29 MED ORDER — ALBUTEROL SULFATE HFA 108 (90 BASE) MCG/ACT IN AERS
2.0000 | INHALATION_SPRAY | Freq: Four times a day (QID) | RESPIRATORY_TRACT | 2 refills | Status: AC | PRN
  Filled 2022-11-29: qty 18, 25d supply, fill #0
  Filled 2022-12-21 (×2): qty 18, 25d supply, fill #1

## 2022-11-29 MED ORDER — FUROSEMIDE 40 MG PO TABS
40.0000 mg | ORAL_TABLET | Freq: Every day | ORAL | 0 refills | Status: DC
  Filled 2022-11-29: qty 5, 5d supply, fill #0

## 2022-11-29 MED ORDER — TRAZODONE HCL 100 MG PO TABS
100.0000 mg | ORAL_TABLET | Freq: Every evening | ORAL | 1 refills | Status: AC | PRN
  Filled 2022-11-29: qty 90, 90d supply, fill #0

## 2022-11-29 MED ORDER — MOMETASONE FURO-FORMOTEROL FUM 100-5 MCG/ACT IN AERO
2.0000 | INHALATION_SPRAY | Freq: Two times a day (BID) | RESPIRATORY_TRACT | 3 refills | Status: AC
  Filled 2022-11-29: qty 13, 30d supply, fill #0
  Filled 2022-12-21 – 2022-12-22 (×2): qty 13, 30d supply, fill #1

## 2022-11-29 MED ORDER — METHYLPREDNISOLONE SODIUM SUCC 125 MG IJ SOLR
62.5000 mg | Freq: Once | INTRAMUSCULAR | Status: AC
  Administered 2022-11-29: 62.5 mg via INTRAMUSCULAR

## 2022-11-29 MED ORDER — PREDNISONE 20 MG PO TABS
40.0000 mg | ORAL_TABLET | Freq: Every day | ORAL | 0 refills | Status: AC
  Filled 2022-11-29: qty 14, 7d supply, fill #0

## 2022-11-29 NOTE — Progress Notes (Signed)
Both legs from the knee down are swollen for 2 days. Difficult to walk and is very painful.

## 2022-11-29 NOTE — Progress Notes (Signed)
Assessment & Plan:  Donald Salas was seen today for hypertension.  Diagnoses and all orders for this visit:  Essential hypertension -     CMP14+EGFR  Primary insomnia Would like to increase trazodone from 50 mg to 100 mg due to ineffectiveness at the lower dosage. -     traZODone (DESYREL) 100 MG tablet; Take 1 tablet (100 mg total) by mouth at bedtime as needed for sleep.  COPD mixed type ([place]) -     predniSONE (DELTASONE) 20 MG tablet; Take 2 tablets (40 mg total) by mouth daily with breakfast for 7 days. FOR BREATHING -     mometasone-formoterol (DULERA) 100-5 MCG/ACT AERO; Inhale 2 puffs into the lungs 2 (two) times daily. -     albuterol (VENTOLIN HFA) 108 (90 Base) MCG/ACT inhaler; Inhale 2 puffs into the lungs every 6 (six) hours as needed for wheezing or shortness of breath. -     methylPREDNISolone sodium succinate (SOLU-MEDROL) 125 mg/2 mL injection 62.5 mg  Prediabetes -     CMP14+EGFR -     Hemoglobin A1c  Cardiomyopathy, alcoholic (HCC) Needs to follow-up with cardiology -     Brain natriuretic peptide -     furosemide (LASIX) 40 MG tablet; Take 1 tablet (40 mg total) by mouth daily for 5 days. -     Ambulatory referral to Cardiology  Dyslipidemia, goal LDL below 100 -     Lipid panel  Anemia, unspecified type -     CBC with Differential    Patient has been counseled on age-appropriate routine health concerns for screening and prevention. These are reviewed and up-to-date. Referrals have been placed accordingly. Immunizations are up-to-date or declined.    Subjective:   Chief Complaint  Patient presents with   Hypertension   HPI Donald Salas 62 y.o. male presents to office today for follow up to HTN.   He endorses increased shortness of breath, wheezing, cough and also endorses BLE edema worsening over the past few days. Has been eating high sodium foods including bologna today.  Using a rolling walker to assist with mobility  He has a past medical history  of Alcohol addiction, Alcoholic cardiomyopathy, Cervical spondylosis with myelopathy, Closed left clavicular fracture (when in middle school), Drug addiction (cocaine), ETOH overuse.   He has been lost to follow up with Cardiology for his history of CM/CHF last noted EF around 40%  Currently prescribed losartan 50 mg daily and metoprolol XL 50 mg daily.    Blood pressure at goal today. He does have bilateral knee pain but has not followed up with ortho for this.   Review of Systems  Constitutional:  Negative for fever, malaise/fatigue and weight loss.  HENT: Negative.  Negative for nosebleeds.   Eyes:  Positive for blurred vision. Negative for double vision and photophobia.  Respiratory:  Positive for cough, sputum production, shortness of breath and wheezing.   Cardiovascular:  Positive for orthopnea and leg swelling. Negative for chest pain and palpitations.  Gastrointestinal: Negative.  Negative for heartburn, nausea and vomiting.  Musculoskeletal:  Positive for joint pain. Negative for myalgias.  Neurological: Negative.  Negative for dizziness, focal weakness, seizures and headaches.  Psychiatric/Behavioral:  Negative for suicidal ideas. The patient has insomnia.     Past Medical History:  Diagnosis Date   Alcohol addiction ([place])    Alcoholic cardiomyopathy ([place])    Cervical spondylosis with myelopathy    Closed left clavicular fracture when in middle school   Drug addiction ([place])  Weakness     Past Surgical History:  Procedure Laterality Date   LEG SURGERY     left chainsaw accident    Family History  Problem Relation Age of Onset   Breast cancer Mother    Other Father        unknown history   Asthma Brother    Eczema Brother     Social History Reviewed with no changes to be made today.   Outpatient Medications Prior to Visit  Medication Sig Dispense Refill   atorvastatin (LIPITOR) 40 MG tablet Take 1 tablet (40 mg total) by mouth daily. 90 tablet 3   losartan  (COZAAR) 50 MG tablet Take 1 tablet (50 mg total) by mouth daily. DOSE CHANGE! 90 tablet 2   metoprolol succinate (TOPROL-XL) 50 MG 24 hr tablet Take 1 tablet (50 mg total) by mouth daily. Take with or immediately following a meal. 90 tablet 3   meloxicam (MOBIC) 7.5 MG tablet Take 1 tablet (7.5 mg total) by mouth daily. For knee pain> take with food (Patient not taking: Reported on 11/29/2022) 30 tablet 1   albuterol (VENTOLIN HFA) 108 (90 Base) MCG/ACT inhaler Inhale 2 puffs into the lungs every 6 (six) hours as needed for wheezing or shortness of breath. (Patient not taking: Reported on 11/29/2022) 18 g 2   mometasone-formoterol (DULERA) 100-5 MCG/ACT AERO Inhale 2 puffs into the lungs 2 (two) times daily. (Patient not taking: Reported on 11/29/2022) 13 g 3   traZODone (DESYREL) 50 MG tablet Take 1 tablet (50 mg total) by mouth at bedtime as needed for sleep. (Patient not taking: Reported on 11/29/2022) 30 tablet 3   No facility-administered medications prior to visit.    No Known Allergies     Objective:    BP 137/86   Pulse 85   Ht '5\' 10"'$  (1.778 m)   Wt 194 lb 9.6 oz (88.3 kg)   SpO2 98%   BMI 27.92 kg/m  Wt Readings from Last 3 Encounters:  11/29/22 194 lb 9.6 oz (88.3 kg)  08/30/22 198 lb 9.6 oz (90.1 kg)  06/08/22 190 lb (86.2 kg)    Physical Exam Vitals and nursing note reviewed.  Constitutional:      Appearance: He is well-developed.  HENT:     Head: Normocephalic and atraumatic.  Cardiovascular:     Rate and Rhythm: Normal rate and regular rhythm.     Heart sounds: Normal heart sounds. No murmur heard.    No friction rub. No gallop.  Pulmonary:     Effort: Pulmonary effort is normal. Tachypnea present. No respiratory distress.     Breath sounds: Decreased air movement present. Wheezing present. No decreased breath sounds, rhonchi or rales.  Chest:     Chest wall: No tenderness.  Abdominal:     General: Bowel sounds are normal.     Palpations: Abdomen is soft.   Musculoskeletal:        General: Normal range of motion.     Cervical back: Normal range of motion.     Right lower leg: Swelling present.     Left lower leg: Swelling present.     Right ankle: Swelling present.     Left ankle: Swelling present.     Right foot: Swelling present.     Left foot: Swelling present.  Skin:    General: Skin is warm and dry.  Neurological:     Mental Status: He is alert and oriented to person, place, and time.  Coordination: Coordination normal.  Psychiatric:        Behavior: Behavior normal. Behavior is cooperative.        Thought Content: Thought content normal.        Judgment: Judgment normal.          Patient has been counseled extensively about nutrition and exercise as well as the importance of adherence with medications and regular follow-up. The patient was given clear instructions to go to ER or return to medical center if symptoms don't improve, worsen or new problems develop. The patient verbalized understanding.   Follow-up: Return in about 3 months (around 03/01/2023).   [name], FNP-BC [facility] and [place], [place]   11/29/2022, 5:57 PM

## 2022-11-30 LAB — CMP14+EGFR
ALT: 34 IU/L (ref 0–44)
AST: 36 IU/L (ref 0–40)
Albumin/Globulin Ratio: 1.6 (ref 1.2–2.2)
Albumin: 4.2 g/dL (ref 3.9–4.9)
Alkaline Phosphatase: 82 IU/L (ref 44–121)
BUN/Creatinine Ratio: 12 (ref 10–24)
BUN: 9 mg/dL (ref 8–27)
Bilirubin Total: 0.4 mg/dL (ref 0.0–1.2)
CO2: 26 mmol/L (ref 20–29)
Calcium: 9.4 mg/dL (ref 8.6–10.2)
Chloride: 99 mmol/L (ref 96–106)
Creatinine, Ser: 0.78 mg/dL (ref 0.76–1.27)
Globulin, Total: 2.6 g/dL (ref 1.5–4.5)
Glucose: 103 mg/dL — ABNORMAL HIGH (ref 70–99)
Potassium: 4.4 mmol/L (ref 3.5–5.2)
Sodium: 139 mmol/L (ref 134–144)
Total Protein: 6.8 g/dL (ref 6.0–8.5)
eGFR: 100 mL/min/{1.73_m2} (ref >59)

## 2022-11-30 LAB — LIPID PANEL
Chol/HDL Ratio: 2.5 ratio (ref 0.0–5.0)
Cholesterol, Total: 108 mg/dL (ref 100–199)
HDL: 44 mg/dL (ref >39)
LDL Chol Calc (NIH): 51 mg/dL (ref 0–99)
Triglycerides: 59 mg/dL (ref 0–149)
VLDL Cholesterol Cal: 13 mg/dL (ref 5–40)

## 2022-11-30 LAB — CBC WITH DIFFERENTIAL/PLATELET
Basophils Absolute: 0 10*3/uL (ref 0.0–0.2)
Basos: 0 %
EOS (ABSOLUTE): 0.2 10*3/uL (ref 0.0–0.4)
Eos: 3 %
Hematocrit: 44.6 % (ref 37.5–51.0)
Hemoglobin: 14.7 g/dL (ref 13.0–17.7)
Immature Grans (Abs): 0 10*3/uL (ref 0.0–0.1)
Immature Granulocytes: 0 %
Lymphocytes Absolute: 1.5 10*3/uL (ref 0.7–3.1)
Lymphs: 20 %
MCH: 29 pg (ref 26.6–33.0)
MCHC: 33 g/dL (ref 31.5–35.7)
MCV: 88 fL (ref 79–97)
Monocytes Absolute: 0.7 10*3/uL (ref 0.1–0.9)
Monocytes: 9 %
Neutrophils Absolute: 5.1 10*3/uL (ref 1.4–7.0)
Neutrophils: 68 %
Platelets: 184 10*3/uL (ref 150–450)
RBC: 5.07 x10E6/uL (ref 4.14–5.80)
RDW: 12.6 % (ref 11.6–15.4)
WBC: 7.5 10*3/uL (ref 3.4–10.8)

## 2022-11-30 LAB — BRAIN NATRIURETIC PEPTIDE: BNP: 37.8 pg/mL (ref 0.0–100.0)

## 2022-11-30 LAB — HEMOGLOBIN A1C
Est. average glucose Bld gHb Est-mCnc: 134 mg/dL
Hgb A1c MFr Bld: 6.3 % — ABNORMAL HIGH (ref 4.8–5.6)

## 2022-12-01 ENCOUNTER — Other Ambulatory Visit: Payer: Self-pay

## 2022-12-06 NOTE — Progress Notes (Signed)
Cardiology Clinic Note   Patient Name: Donald Salas Date of Encounter: 12/08/2022  Primary Care Provider:  [name], NP Primary Cardiologist:  [name], MD  Patient Profile    Donald Salas 62 year old male presents the clinic for follow-up evaluation of his dilated/alcoholic cardiomyopathy.  Past Medical History    Past Medical History:  Diagnosis Date   Alcohol addiction ([place])    Alcoholic cardiomyopathy ([place])    Cervical spondylosis with myelopathy    Closed left clavicular fracture when in middle school   Drug addiction ([place])    Weakness    Past Surgical History:  Procedure Laterality Date   LEG SURGERY     left chainsaw accident    Allergies  No Known Allergies  History of Present Illness    Donald Salas has a PMH of dilated cardiomyopathy with an EF of 25-30% showing global hypokinesis.  He has no other past cardiac history.  His nuclear stress test showed no ischemia.  He was lost to follow-up.  He was seen back in the clinic fall 2021 and had a follow-up echocardiogram which showed an EF of 45-50%.  He was seen by [name] 10/19/2020.  He reported he had been drinking 1 beer per week.  He denied chest pain, neck discomfort, and arm discomfort.  He denied new shortness of breath, PND and orthopnea.  He denied palpitations, presyncope, and syncope.  He was noted to have 1 girdle weakness, back problems, and joint problems.  He presented to the clinic 12/08/20 for follow-up evaluation stated he had pain in his back and shoulders.  He also noticed pain in his right leg.  These were related to orthopedic issues that he  had for quite some time.  He reported that he had been out of his lisinopril and his metoprolol for around 3 days.  His heart rate was slightly elevated  at 107 and 98 on recheck.  He reported he was limited in his physical activity due to his back shoulder and leg pain.  His blood pressure was well controlled at 120/88.  He reported that  he ate a heart healthy low-sodium diet and did not add salt to his food.  I  asked him to increase his physical activity as tolerated, gave him the heart healthy low-sodium diet paperwork, and planned follow-up in 6 months.  He was seen in follow-up by [name] on 12/16/2021.  He had not been taking his medications.  He no longer had a PCP.  He was drinking 340 ounce beers per week.  He denied cardiac symptoms.  He noted shortness of breath with walking 40 yards on level ground.  He denied orthopnea and PND.  He was noted to have a slow and shuffling gait.  He was living by himself.  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  He was started on metoprolol XL 50 mg daily, echocardiogram was ordered and he was instructed to stop drinking.  His echocardiogram has not yet been completed.  His lab work from 12/16/2028 was unremarkable.  He presented to the clinic 01/27/22 for follow-up evaluation stated he felt well.  He was limited in his physical activity due to his right knee stiffness.  He reported that he had dietary discretion and was eating a regular diet.  Initially his blood pressure was160/88 and on recheck was 136/84.  He reported compliance with his metoprolol medication.  He reported that he did not receive a call or have his echocardiogram  scheduled.  I stressed the importance of follow-up echocardiogram and started him on losartan 25 mg today.  He continued to drink about 40 ounces of beer daily.  I stressed the importance of cutting back and quitting.  I planned RTC in 1 to 2 weeks for BMP and plan follow-up for 1 to 2 months after his echocardiogram.  His echocardiogram 02/10/2022 showed an LVEF of 40-45%, G1 DD trivial mitral valve regurgitation and mild dilation of the ascending aorta measuring 40 mm.  He underwent cardiac MRI which showed an LVEF of 47% and no left ventricular thrombus mild tricuspid regurgitation was noted and his ascending aorta measured 41 mm.  He presents to the clinic today for  follow-up evaluation and states he is limited in his physical activity due to his joint issues.  We reviewed his previous echocardiogram and he expressed understanding.  He reports that he continues to drink a 40 of beer per week.  I encouraged him to continue to cut back and quit.  He expressed understanding.  His blood pressure initially today is 142/80 and on recheck is 128/80.  I will increase his losartan to 100 mg daily and repeat his BMP in 1 week.  We will give him the salty 6 diet sheet and plan follow-up in 6 to 9 months.  Today he denies chest pain, shortness of breath, lower extremity edema, fatigue, palpitations, melena, hematuria, hemoptysis, diaphoresis, weakness, presyncope, syncope, orthopnea, and PND.   Home Medications    Prior to Admission medications   Medication Sig Start Date End Date Taking? Authorizing Provider  lisinopril (ZESTRIL) 10 MG tablet Take 1 tablet (10 mg total) by mouth daily. 10/19/20 01/17/21  [name], MD  metoprolol succinate (TOPROL XL) 50 MG 24 hr tablet Take 1 tablet (50 mg total) by mouth daily. 10/19/20   [name], MD    Family History    Family History  Problem Relation Age of Onset   Breast cancer Mother    Other Father        unknown history   Asthma Brother    Eczema Brother    He indicated that his mother is deceased. He indicated that his father is deceased. He indicated that his brother is alive.  Social History    Social History   Socioeconomic History   Marital status: Single    Spouse name: Not on file   Number of children: 5   Years of education: 12   Highest education level: High school graduate  Occupational History   Occupation: Unemployed  Tobacco Use   Smoking status: Every Day    Packs/day: 1.00    Years: 79.00    Additional pack years: 0.00    Total pack years: 79.00    Types: Cigarettes    Start date: 09/09/1969   Smokeless tobacco: Never  Vaping Use   Vaping Use: Never used  Substance and  Sexual Activity   Alcohol use: Yes    Alcohol/week: 0.0 standard drinks of alcohol    Comment: 1-4 - 40oz malt liquor per day   Drug use: Yes    Types: Cocaine    Comment: Crack cocaine every day--smokes, Rare MJ.  Was in prison 4-5 years ago for 4 months, but started using after released.  No treatment otherwise.     Sexual activity: Never  Other Topics Concern   Not on file  Social History Narrative   Born and raised in [place].   Graduated from [employer].   Was  in [employer].     Started drugs with different women he was with throughout life.    Lives alone in a house.  Not clear how he supports his habits.    Right-handed.   No caffeine use.   Social Determinants of Health   Financial Resource Strain: Not on file  Food Insecurity: Not on file  Transportation Needs: Not on file  Physical Activity: Not on file  Stress: Not on file  Social Connections: Not on file  Intimate Partner Violence: Not on file     Review of Systems    General:  No chills, fever, night sweats or weight changes.  Cardiovascular:  No chest pain, dyspnea on exertion, edema, orthopnea, palpitations, paroxysmal nocturnal dyspnea. Dermatological: No rash, lesions/masses Respiratory: No cough, dyspnea Urologic: No hematuria, dysuria Abdominal:   No nausea, vomiting, diarrhea, bright red blood per rectum, melena, or hematemesis Neurologic:  No visual changes, wkns, changes in mental status. All other systems reviewed and are otherwise negative except as noted above.  Physical Exam    VS:  BP 128/80   Pulse 97   Ht 5\' 10"  (1.778 m)   Wt 195 lb (88.5 kg)   SpO2 93%   BMI 27.98 kg/m  , BMI Body mass index is 27.98 kg/m. GEN: Well nourished, well developed, in no acute distress. HEENT: normal. Neck: Supple, no JVD, carotid bruits, or masses. Cardiac: RRR, no murmurs, rubs, or gallops. No clubbing, cyanosis, edema.  Radials/DP/PT 2+ and equal bilaterally.  Respiratory:  Respirations regular and  unlabored, clear to auscultation bilaterally. GI: Soft, nontender, nondistended, BS + x 4. MS: no deformity or atrophy. Skin: warm and dry, no rash. Neuro:  Strength and sensation are intact. Psych: Normal affect.  Accessory Clinical Findings    Recent Labs: 12/16/2021: TSH 1.580 11/29/2022: ALT 34; BNP 37.8; BUN 9; Creatinine, Ser 0.78; Hemoglobin 14.7; Platelets 184; Potassium 4.4; Sodium 139   Recent Lipid Panel    Component Value Date/Time   CHOL 108 11/29/2022 1619   TRIG 59 11/29/2022 1619   HDL 44 11/29/2022 1619   CHOLHDL 2.5 11/29/2022 1619   LDLCALC 51 11/29/2022 1619    ECG personally reviewed by me today-normal sinus rhythm biatrial enlargement inferior infarct undetermined age 53 bpm  Echocardiogram 02/10/2022  IMPRESSIONS     1. Swirling suggestive of low flow state and increased risk for thrombus  formation noted at LV apex; consider cardiac MRI to fully R/O apical  thrombus.   2. Left ventricular ejection fraction, by estimation, is 40 to 45%. The  left ventricle has mildly decreased function. The left ventricle  demonstrates global hypokinesis. There is severe concentric left  ventricular hypertrophy. Left ventricular diastolic   parameters are consistent with Grade I diastolic dysfunction (impaired  relaxation).   3. Right ventricular systolic function is normal. The right ventricular  size is normal.   4. The mitral valve is normal in structure. Trivial mitral valve  regurgitation. No evidence of mitral stenosis.   5. The aortic valve is tricuspid. Aortic valve regurgitation is not  visualized. No aortic stenosis is present.   6. Aortic dilatation noted. There is mild dilatation of the ascending  aorta, measuring 40 mm.   7. The inferior vena cava is normal in size with greater than 50%  respiratory variability, suggesting right atrial pressure of 3 mmHg.   FINDINGS   Left Ventricle: Left ventricular ejection fraction, by estimation, is 40  to 45%.  The left ventricle has mildly  decreased function. The left  ventricle demonstrates global hypokinesis. Definity contrast agent was  given IV to delineate the left ventricular   endocardial borders. The left ventricular internal cavity size was normal  in size. There is severe concentric left ventricular hypertrophy. Abnormal  (paradoxical) septal motion, consistent with left bundle branch block.  Left ventricular diastolic  parameters are consistent with Grade I diastolic dysfunction (impaired  relaxation).   Right Ventricle: The right ventricular size is normal. Right ventricular  systolic function is normal.   Left Atrium: Left atrial size was normal in size.   Right Atrium: Right atrial size was normal in size.   Pericardium: There is no evidence of pericardial effusion.   Mitral Valve: The mitral valve is normal in structure. Trivial mitral  valve regurgitation. No evidence of mitral valve stenosis.   Tricuspid Valve: The tricuspid valve is normal in structure. Tricuspid  valve regurgitation is mild . No evidence of tricuspid stenosis.   Aortic Valve: The aortic valve is tricuspid. Aortic valve regurgitation is  not visualized. No aortic stenosis is present.   Pulmonic Valve: The pulmonic valve was normal in structure. Pulmonic valve  regurgitation is trivial. No evidence of pulmonic stenosis.   Aorta: The aortic root is normal in size and structure and aortic  dilatation noted. There is mild dilatation of the ascending aorta,  measuring 40 mm.   Venous: The inferior vena cava is normal in size with greater than 50%  respiratory variability, suggesting right atrial pressure of 3 mmHg.   IAS/Shunts: No atrial level shunt detected by color flow Doppler.   Additional Comments: Swirling suggestive of low flow state and increased  risk for thrombus formation noted at LV apex; consider cardiac MRI to  fully R/O apical thrombus.   Echocardiogram 08/10/2020 IMPRESSIONS      1. Left ventricular ejection fraction, by estimation, is 45 to 50%. The  left ventricle has mildly decreased function. The left ventricle  demonstrates global hypokinesis. There is mild concentric left ventricular  hypertrophy. Left ventricular diastolic  parameters are consistent with Grade I diastolic dysfunction (impaired  relaxation).   2. Right ventricular systolic function is normal. The right ventricular  size is normal. Tricuspid regurgitation signal is inadequate for assessing  PA pressure.   3. The mitral valve is normal in structure. No evidence of mitral valve  regurgitation. No evidence of mitral stenosis.   4. The aortic valve is normal in structure. Aortic valve regurgitation is  not visualized. No aortic stenosis is present.   5. Aortic dilatation noted. There is mild dilatation of the ascending  aorta, measuring 42 mm.   6. The inferior vena cava is normal in size with greater than 50%  respiratory variability, suggesting right atrial pressure of 3 mmHg.   Comparison(s): Prior images unable to be directly viewed, comparison made  by report only. The left ventricular function has improved.   Cardiac MRI 09/13/2022  IMPRESSION: 1.  No LV thrombus seen   2. Moderate asymmetric LV hypertrophy measuring 101mm in basal septum (60mm in posterior wall), not meeting criteria for hypertrophic cardiomyopathy (<109mm)   3.  Normal LV size with mild systolic dysfunction (EF 99991111)   4.  Normal RV size with mild systolic dysfunction (EF 0000000)   5. RV insertion site late gadolinium enhancement, which is a nonspecific scar pattern often seen in setting of elevated pulmonary pressures   6.  Mild tricuspid regurgitation (regurgitant fraction 16%)   7.  Dilated ascending aorta measuring  53mm     Electronically Signed   By: Oswaldo Milian M.D.   On: 09/16/2022 22:51  Assessment & Plan   1.  Dilated/alcoholic cardiomyopathy-continues to note increased work of breathing  with walking on level ground..  Echocardiogram 08/10/2020 showed improved LVEF 45-50% and G1 DD. Continue  metoprolol  Increase losartan to 100 mg Heart healthy low-sodium diet-continue Increase physical activity as tolerated Repeat BMP in 1-2 weeks.  Essential hypertension-BP today 128/80.  Well-controlled at home. Continue  metoprolol, losartan Heart healthy low-sodium diet-salty 6 given Increase physical activity as tolerated Maintain blood pressure log  Aortic root dilation-echocardiogram showed mild dilation of the ascending aorta at 42 mm.   Continue  metoprolol Maintain good blood pressure control Heart healthy low-sodium diet Plan for CT angio chest aorta annually.  Disposition: Follow-up with [name] or me 6-9 months   Jossie Ng. Bran Aldridge NP-C    12/08/2022, 8:42 AM [address] Office [phone] Fax [phone]  Notice: This dictation was prepared with Dragon dictation along with smaller phrase technology. Any transcriptional errors that result from this process are unintentional and may not be corrected upon review.  I spent  14 minutes examining this patient, reviewing medications, and using patient centered shared decision making involving her cardiac care.  Prior to her visit I spent greater than 20 minutes reviewing her past medical history,  medications, and prior cardiac tests.

## 2022-12-08 ENCOUNTER — Encounter: Payer: Self-pay | Admitting: General Practice

## 2022-12-08 ENCOUNTER — Other Ambulatory Visit: Payer: Self-pay

## 2022-12-08 ENCOUNTER — Ambulatory Visit: Payer: Medicaid Other | Attending: General Practice | Admitting: General Practice

## 2022-12-08 VITALS — BP 128/80 | HR 97 | Ht 70.0 in | Wt 195.0 lb

## 2022-12-08 DIAGNOSIS — I513 Intracardiac thrombosis, not elsewhere classified: Secondary | ICD-10-CM

## 2022-12-08 DIAGNOSIS — I426 Alcoholic cardiomyopathy: Secondary | ICD-10-CM | POA: Diagnosis not present

## 2022-12-08 DIAGNOSIS — I10 Essential (primary) hypertension: Secondary | ICD-10-CM | POA: Diagnosis not present

## 2022-12-08 DIAGNOSIS — I420 Dilated cardiomyopathy: Secondary | ICD-10-CM | POA: Diagnosis not present

## 2022-12-08 DIAGNOSIS — I7789 Other specified disorders of arteries and arterioles: Secondary | ICD-10-CM

## 2022-12-08 MED ORDER — LOSARTAN POTASSIUM 100 MG PO TABS
100.0000 mg | ORAL_TABLET | Freq: Every day | ORAL | 9 refills | Status: AC
  Filled 2022-12-08 – 2022-12-21 (×3): qty 30, 30d supply, fill #0

## 2022-12-08 NOTE — Patient Instructions (Signed)
Medication Instructions:  INCREASE LOSARTAN 100MG  DAILY; MAY TAKE 2 OF THE 50MG  *If you need a refill on your cardiac medications before your next appointment, please call your pharmacy*  Lab Work: BMET IN 1-2 WEEKS If you have labs (blood work) drawn today and your tests are completely normal, you will receive your results only by:  [place] (if you have MyChart) OR  A paper copy in the mail If you have any lab test that is abnormal or we need to change your treatment, we will call you to review the results.  Testing/Procedures: NONE  Follow-Up: At [facility], you and your health needs are our priority.  As part of our continuing mission to provide you with exceptional heart care, we have created designated Provider Care Teams.  These Care Teams include your primary Cardiologist (physician) and Advanced Practice Providers (APPs -  Physician Assistants and Nurse Practitioners) who all work together to provide you with the care you need, when you need it.  We recommend signing up for the patient portal called "MyChart".  Sign up information is provided on this After Visit Summary.  MyChart is used to connect with patients for Virtual Visits (Telemedicine).  Patients are able to view lab/test results, encounter notes, upcoming appointments, etc.  Non-urgent messages can be sent to your provider as well.   To learn more about what you can do with MyChart, go to [email or web address].    Your next appointment:   6-9 month(s)  Provider:   [name], MD  or Coletta Memos, FNP        Other Instructions  PLEASE READ AND FOLLOW ATTACHED  SALTY 6

## 2022-12-09 LAB — BASIC METABOLIC PANEL
BUN/Creatinine Ratio: 19 (ref 10–24)
BUN: 15 mg/dL (ref 8–27)
CO2: 28 mmol/L (ref 20–29)
Calcium: 9.9 mg/dL (ref 8.6–10.2)
Chloride: 95 mmol/L — ABNORMAL LOW (ref 96–106)
Creatinine, Ser: 0.79 mg/dL (ref 0.76–1.27)
Glucose: 94 mg/dL (ref 70–99)
Potassium: 4.2 mmol/L (ref 3.5–5.2)
Sodium: 138 mmol/L (ref 134–144)
eGFR: 100 mL/min/{1.73_m2} (ref >59)

## 2022-12-14 ENCOUNTER — Other Ambulatory Visit: Payer: Self-pay

## 2022-12-21 ENCOUNTER — Other Ambulatory Visit: Payer: Self-pay

## 2022-12-22 ENCOUNTER — Other Ambulatory Visit: Payer: Self-pay

## 2022-12-25 ENCOUNTER — Other Ambulatory Visit: Payer: Self-pay

## 2023-03-05 ENCOUNTER — Ambulatory Visit: Payer: Medicaid Other | Admitting: Nurse Practitioner

## 2023-03-05 ENCOUNTER — Ambulatory Visit: Payer: Self-pay | Admitting: Nurse Practitioner

## 2023-03-07 ENCOUNTER — Ambulatory Visit: Payer: Self-pay | Admitting: Nurse Practitioner

## 2023-04-26 ENCOUNTER — Other Ambulatory Visit: Payer: Self-pay

## 2023-06-10 NOTE — Progress Notes (Deleted)
Cardiology Office Note:   Date:  06/10/2023  ID:  Donald Salas, DOB 10-27-1960, MRN [id number] PCP: Claiborne Rigg, NP  [facility] Providers Cardiologist:  Rollene Rotunda, MD {  History of Present Illness:   Donald Salas is a 62 y.o. male who presents for evaluation of an abnormal echo.   I saw him in 2019 when he was found to have an EF of 25 - 30% with global hypokinesis.   He had no past cardiac history otherwise.  He did have a stress perfusion study which did not suggest ischemia.  He then seems to have been lost to follow-up.  Fall 2023 he had an echo with a stable Ef of 40 - 45%.  He had moderate apical LVH not meeting criteria for HCM.  There was no LV thrombus.  The aorta was 41 mm.    ***    He has not been seen since last year.  It does not sound like he is not taking any of his medicines.  He does not have a primary provider anymore.  He drinks about 3 40 ounce beers a week.  He denies any cardiovascular symptoms such as chest pressure, neck or arm discomfort.  He does get short of breath just walking 40 yards on level ground.  He is not really describing PND or orthopnea.  He seems to have a slow and somewhat shuffling gait.  He lives by himself.  He does not have a cell phone but he does have a home phone.  There is a number for his aunt listed and she is also contact.  He is not describing PND or orthopnea.  He is not really describing chest pressure, neck or arm discomfort.  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      ROS: ***  Studies Reviewed:    EKG:       ***  Risk Assessment/Calculations:   {Does this patient have ATRIAL FIBRILLATION?:[phone]} No BP recorded.  {Refresh Note OR Click here to enter BP  :1}***        Physical Exam:   VS:  There were no vitals taken for this visit.   Wt Readings from Last 3 Encounters:  12/08/22 195 lb (88.5 kg)  11/29/22 194 lb 9.6 oz (88.3 kg)  08/30/22 198 lb 9.6 oz (90.1 kg)     GEN: Well nourished, well developed in  no acute distress NECK: No JVD; No carotid bruits CARDIAC: ***RRR, no murmurs, rubs, gallops RESPIRATORY:  Clear to auscultation without rales, wheezing or rhonchi  ABDOMEN: Soft, non-tender, non-distended EXTREMITIES:  No edema; No deformity   ASSESSMENT AND PLAN:   Cardiomyopathy:  ***  Aortic enlargement:  I will follow up with repeat CT in the fall of next year.    HTN:  ***    {Are you ordering a CV Procedure (e.g. stress test, cath, DCCV, TEE, etc)?   Press F2        :[id number]}  Follow up ***  Signed, Rollene Rotunda, MD

## 2023-06-11 ENCOUNTER — Ambulatory Visit: Payer: Medicaid Other | Attending: Cardiology | Admitting: Cardiology

## 2023-06-11 DIAGNOSIS — I420 Dilated cardiomyopathy: Secondary | ICD-10-CM

## 2023-06-11 DIAGNOSIS — I10 Essential (primary) hypertension: Secondary | ICD-10-CM

## 2023-12-24 ENCOUNTER — Encounter: Payer: Self-pay | Admitting: Cardiology

## 2023-12-24 DIAGNOSIS — I10 Essential (primary) hypertension: Secondary | ICD-10-CM

## 2023-12-24 HISTORY — DX: Essential (primary) hypertension: I10

## 2023-12-24 NOTE — Progress Notes (Deleted)
 Cardiology Office Note:   Date:  12/24/2023  ID:  DAJOHN ELLENDER, DOB 06/04/1961, MRN [id number] PCP: Claiborne Rigg, NP  [facility] Providers Cardiologist:  Rollene Rotunda, MD {  History of Present Illness:   Donald Salas is a 62 y.o. male who presents for evaluation of an abnormal echo.   I saw him in 2019 when he was found to have an EF of 25 - 30% with global hypokinesis.   He had no past cardiac history otherwise.  He did have a stress perfusion study which did not suggest ischemia.  He then seems to have been lost to follow-up.  He came back fall 2021 and had a follow up echo with an EF of 45 - 50%.       He presents for one year follow up.  He was off all of his meds last year.  ***   ***   has not been seen since last year.  It does not sound like he is not taking any of his medicines.  He does not have a primary provider anymore.  He drinks about 3 40 ounce beers a week.  He denies any cardiovascular symptoms such as chest pressure, neck or arm discomfort.  He does get short of breath just walking 40 yards on level ground.  He is not really describing PND or orthopnea.  He seems to have a slow and somewhat shuffling gait.  He lives by himself.  He does not have a cell phone but he does have a home phone.  There is a number for his aunt listed and she is also contact.  He is not describing PND or orthopnea.  He is not really describing chest pressure, neck or arm discomfort.  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        ROS: ***  Studies Reviewed:    EKG:       ***  Risk Assessment/Calculations:   {Does this patient have ATRIAL FIBRILLATION?[phone]} No BP recorded.  {Refresh Note OR Click here to enter BP  :1}***        Physical Exam:   VS:  There were no vitals taken for this visit.   Wt Readings from Last 3 Encounters:  12/08/22 195 lb (88.5 kg)  11/29/22 194 lb 9.6 oz (88.3 kg)  08/30/22 198 lb 9.6 oz (90.1 kg)     GEN: Well nourished, well developed in no  acute distress NECK: No JVD; No carotid bruits CARDIAC: ***RR, *** murmurs, rubs, gallops RESPIRATORY:  Clear to auscultation without rales, wheezing or rhonchi  ABDOMEN: Soft, non-tender, non-distended EXTREMITIES:  No edema; No deformity   ASSESSMENT AND PLAN:   CARDIOMYOPATHY:      His EF was 45%.  ***  but he is off of all of his meds.  He still drinks some alcohol.  We are kind of at square 1 and I am going to start with complete blood work to include a c-Met, CBC and TSH.  I will go ahead and start Toprol-XL 50 mg daily.   We can continue then to titrate his meds based on his blood pressure and electrolytes.  Eventually we would repeat another echocardiogram.  He understands he needs to stop drinking.    AORTIC ROOT ENLARGEMENT: This was 42 mm on echo in 2021.  ***    I might eventually follow this up with CT.   HTN:  ***  This is being managed in the context of treating his CHF  Follow up ***  Signed, Rollene Rotunda, MD

## 2023-12-27 ENCOUNTER — Ambulatory Visit: Attending: Cardiology | Admitting: Cardiology

## 2023-12-27 DIAGNOSIS — I420 Dilated cardiomyopathy: Secondary | ICD-10-CM

## 2023-12-27 DIAGNOSIS — I10 Essential (primary) hypertension: Secondary | ICD-10-CM

## 2023-12-27 DIAGNOSIS — I7789 Other specified disorders of arteries and arterioles: Secondary | ICD-10-CM

## 2023-12-28 ENCOUNTER — Encounter: Payer: Self-pay | Admitting: Cardiology
